# Patient Record
Sex: Male | Born: 1980 | Race: Black or African American | Hispanic: No | Marital: Married | Smoking: Never smoker
Health system: Southern US, Community
[De-identification: ages and names within clinical notes are randomized; demographics above are authoritative.]

## PROBLEM LIST (undated history)

## (undated) DIAGNOSIS — K219 Gastro-esophageal reflux disease without esophagitis: Secondary | ICD-10-CM

## (undated) DIAGNOSIS — Z87442 Personal history of urinary calculi: Secondary | ICD-10-CM

## (undated) DIAGNOSIS — K259 Gastric ulcer, unspecified as acute or chronic, without hemorrhage or perforation: Secondary | ICD-10-CM

## (undated) HISTORY — DX: Gastric ulcer, unspecified as acute or chronic, without hemorrhage or perforation: K25.9

---

## 2013-10-27 DIAGNOSIS — K259 Gastric ulcer, unspecified as acute or chronic, without hemorrhage or perforation: Secondary | ICD-10-CM

## 2013-10-27 HISTORY — DX: Gastric ulcer, unspecified as acute or chronic, without hemorrhage or perforation: K25.9

## 2013-12-31 ENCOUNTER — Encounter (HOSPITAL_COMMUNITY): Payer: Self-pay | Admitting: Emergency Medicine

## 2013-12-31 ENCOUNTER — Inpatient Hospital Stay (HOSPITAL_COMMUNITY)
Admission: EM | Admit: 2013-12-31 | Discharge: 2014-01-02 | DRG: 378 | Disposition: A | Discharge status: Home / Self Care | Payer: 59 | Attending: Internal Medicine | Admitting: Internal Medicine

## 2013-12-31 DIAGNOSIS — K219 Gastro-esophageal reflux disease without esophagitis: Secondary | ICD-10-CM | POA: Diagnosis present

## 2013-12-31 DIAGNOSIS — E860 Dehydration: Secondary | ICD-10-CM | POA: Diagnosis present

## 2013-12-31 DIAGNOSIS — R55 Syncope and collapse: Secondary | ICD-10-CM

## 2013-12-31 DIAGNOSIS — D649 Anemia, unspecified: Secondary | ICD-10-CM

## 2013-12-31 DIAGNOSIS — Y92009 Unspecified place in unspecified non-institutional (private) residence as the place of occurrence of the external cause: Secondary | ICD-10-CM

## 2013-12-31 DIAGNOSIS — W19XXXA Unspecified fall, initial encounter: Secondary | ICD-10-CM | POA: Diagnosis present

## 2013-12-31 DIAGNOSIS — S0180XA Unspecified open wound of other part of head, initial encounter: Secondary | ICD-10-CM | POA: Diagnosis present

## 2013-12-31 DIAGNOSIS — D62 Acute posthemorrhagic anemia: Secondary | ICD-10-CM

## 2013-12-31 DIAGNOSIS — F1010 Alcohol abuse, uncomplicated: Secondary | ICD-10-CM

## 2013-12-31 DIAGNOSIS — K922 Gastrointestinal hemorrhage, unspecified: Secondary | ICD-10-CM | POA: Diagnosis present

## 2013-12-31 DIAGNOSIS — K264 Chronic or unspecified duodenal ulcer with hemorrhage: Principal | ICD-10-CM

## 2013-12-31 DIAGNOSIS — S0181XA Laceration without foreign body of other part of head, initial encounter: Secondary | ICD-10-CM

## 2013-12-31 HISTORY — DX: Gastro-esophageal reflux disease without esophagitis: K21.9

## 2013-12-31 LAB — BASIC METABOLIC PANEL
BUN: 37 mg/dL — ABNORMAL HIGH (ref 6–23)
CHLORIDE: 100 meq/L (ref 96–112)
CO2: 25 meq/L (ref 19–32)
CREATININE: 1.11 mg/dL (ref 0.50–1.35)
Calcium: 8.1 mg/dL — ABNORMAL LOW (ref 8.4–10.5)
GFR calc Af Amer: >90 mL/min (ref >90)
GFR calc non Af Amer: 85 mL/min — ABNORMAL LOW (ref >90)
GLUCOSE: 80 mg/dL (ref 70–99)
Potassium: 3.7 mEq/L (ref 3.7–5.3)
Sodium: 137 mEq/L (ref 137–147)

## 2013-12-31 LAB — CBC
HCT: 30.2 % — ABNORMAL LOW (ref 39.0–52.0)
HEMOGLOBIN: 10.5 g/dL — AB (ref 13.0–17.0)
MCH: 29.5 pg (ref 26.0–34.0)
MCHC: 34.8 g/dL (ref 30.0–36.0)
MCV: 84.8 fL (ref 78.0–100.0)
PLATELETS: 241 10*3/uL (ref 150–400)
RBC: 3.56 MIL/uL — AB (ref 4.22–5.81)
RDW: 12.6 % (ref 11.5–15.5)
WBC: 8 10*3/uL (ref 4.0–10.5)

## 2013-12-31 LAB — I-STAT CHEM 8, ED
BUN: 35 mg/dL — AB (ref 6–23)
Calcium, Ion: 1.1 mmol/L — ABNORMAL LOW (ref 1.12–1.23)
Chloride: 100 mEq/L (ref 96–112)
Creatinine, Ser: 1.3 mg/dL (ref 0.50–1.35)
GLUCOSE: 83 mg/dL (ref 70–99)
HCT: 32 % — ABNORMAL LOW (ref 39.0–52.0)
Hemoglobin: 10.9 g/dL — ABNORMAL LOW (ref 13.0–17.0)
POTASSIUM: 3.7 meq/L (ref 3.7–5.3)
Sodium: 140 mEq/L (ref 137–147)
TCO2: 24 mmol/L (ref 0–100)

## 2013-12-31 LAB — I-STAT TROPONIN, ED: Troponin i, poc: 0.01 ng/mL (ref 0.00–0.08)

## 2013-12-31 NOTE — ED Notes (Signed)
EMS adm 500ml NS, 324mg  ASA

## 2013-12-31 NOTE — ED Provider Notes (Signed)
CSN: [id number]     Arrival date & time 12/31/13  2249 History   None    No chief complaint on file.    (Consider location/radiation/quality/duration/timing/severity/associated sxs/prior Treatment) HPI This patient is a 33 year old man who is brought to the emergency department by EMS after syncopal event. The patient says he was sitting on a couch with a friend watching a college basketball game when he fell he might pass out. He said up to walk to his bedroom and lay down. However, shortly after that, he felt lightheaded, passed out and fell to floor. He tried to get up from the floor then passed out again. His head struck the wall.  Paramedics report that the patient had a systolic blood pressure in the 80s and was tachycardic at the scene. The patient denies experiencing any chest pain, shortness of breath or abdominal pain. He denies history of syncope. He denies melena. He states that he has had #2 12 ounce beers tonight. No illicit drug use.   No past medical history on file. No past surgical history on file. No family history on file. History  Substance Use Topics  . Smoking status: Not on file  . Smokeless tobacco: Not on file  . Alcohol Use: Not on file    Review of Systems Ten point review of symptoms performed and is negative with the exception of symptoms noted above.     Allergies  Review of patient's allergies indicates not on file.  Home Medications  No current outpatient prescriptions on file. There were no vitals taken for this visit. Physical Exam Gen: well developed and well nourished appearing Head: 3cm lac right forehead, otherwise ncat Eyes: PERL, EOMI Nose: no epistaixis or rhinorrhea Mouth/throat: mucosa is moist and pink Neck: supple, no stridor Lungs: CTA B, no wheezing, rhonchi or rales CV: rapid and regular, pulse 104 bpm, no murmur, extremities appear well perfused.  Abd: soft, notender, nondistended Back: no ttp, no cva ttp Skin: warm and  dry Ext: normal to inspection, no dependent edema Neuro: CN ii-xii grossly intact, no focal deficits Psyche; normal affect, calm and cooperative.   ED Course  Procedures (including critical care time) Labs Review  DRE performed by RN, Vernona Rieger, she states that stool is black and hemeoccult positive.   Results for orders placed during the hospital encounter of 12/31/13 (from the past 24 hour(s))  CBC     Status: Abnormal   Collection Time    12/31/13 11:28 PM      Result Value Ref Range   WBC 8.0  4.0 - 10.5 K/uL   RBC 3.56 (*) 4.22 - 5.81 MIL/uL   Hemoglobin 10.5 (*) 13.0 - 17.0 g/dL   HCT 40.9 (*) 81.1 - 91.4 %   MCV 84.8  78.0 - 100.0 fL   MCH 29.5  26.0 - 34.0 pg   MCHC 34.8  30.0 - 36.0 g/dL   RDW 78.2  95.6 - 21.3 %   Platelets 241  150 - 400 K/uL  BASIC METABOLIC PANEL     Status: Abnormal   Collection Time    12/31/13 11:28 PM      Result Value Ref Range   Sodium 137  137 - 147 mEq/L   Potassium 3.7  3.7 - 5.3 mEq/L   Chloride 100  96 - 112 mEq/L   CO2 25  19 - 32 mEq/L   Glucose, Bld 80  70 - 99 mg/dL   BUN 37 (*) 6 - 23 mg/dL  Creatinine, Ser 1.11  0.50 - 1.35 mg/dL   Calcium 8.1 (*) 8.4 - 10.5 mg/dL   GFR calc non Af Amer 85 (*) >90 mL/min   GFR calc Af Amer >90  >90 mL/min  I-STAT TROPOININ, ED     Status: None   Collection Time    12/31/13 11:35 PM      Result Value Ref Range   Troponin i, poc 0.01  0.00 - 0.08 ng/mL   Comment 3           I-STAT CHEM 8, ED     Status: Abnormal   Collection Time    12/31/13 11:38 PM      Result Value Ref Range   Sodium 140  137 - 147 mEq/L   Potassium 3.7  3.7 - 5.3 mEq/L   Chloride 100  96 - 112 mEq/L   BUN 35 (*) 6 - 23 mg/dL   Creatinine, Ser 1.611.30  0.50 - 1.35 mg/dL   Glucose, Bld 83  70 - 99 mg/dL   Calcium, Ion 0.961.10 (*) 1.12 - 1.23 mmol/L   TCO2 24  0 - 100 mmol/L   Hemoglobin 10.9 (*) 13.0 - 17.0 g/dL   HCT 04.532.0 (*) 40.939.0 - 81.152.0 %  ETHANOL     Status: None   Collection Time    01/01/14 12:22 AM      Result  Value Ref Range   Alcohol, Ethyl (B) <11  0 - 11 mg/dL  CBC     Status: Abnormal   Collection Time    01/01/14  1:04 AM      Result Value Ref Range   WBC 7.4  4.0 - 10.5 K/uL   RBC 3.35 (*) 4.22 - 5.81 MIL/uL   Hemoglobin 9.7 (*) 13.0 - 17.0 g/dL   HCT 91.428.5 (*) 78.239.0 - 95.652.0 %   MCV 85.1  78.0 - 100.0 fL   MCH 29.0  26.0 - 34.0 pg   MCHC 34.0  30.0 - 36.0 g/dL   RDW 21.312.8  08.611.5 - 57.815.5 %   Platelets 256  150 - 400 K/uL  POC OCCULT BLOOD, ED     Status: Abnormal   Collection Time    01/01/14  1:44 AM      Result Value Ref Range   Fecal Occult Bld POSITIVE (*) NEGATIVE   EKG: nsr, no acute ischemic changes, normal intervals, normal axis, normal qrs complex with exception of signs of early repolarization in the inferolateral leads.   CRITICAL CARE Performed by: Brandt LoosenManly, Tyrah Broers   Total critical care time. 5239m.   Critical care time was exclusive of separately billable procedures and treating other patients.  Critical care was necessary to treat or prevent imminent or life-threatening deterioration.  Critical care was time spent personally by me on the following activities: development of treatment plan with patient and/or surrogate as well as nursing, discussions with consultants, evaluation of patient's response to treatment, examination of patient, obtaining history from patient or surrogate, ordering and performing treatments and interventions, ordering and review of laboratory studies, ordering and review of radiographic studies, pulse oximetry and re-evaluation of patient's condition.  LACERATION REPAIR Performed by: Brandt LoosenManly, Rai Sinagra Authorized by: Brandt LoosenManly, Aaditya Letizia Consent: Verbal consent obtained. Risks and benefits: risks, benefits and alternatives were discussed Consent given by: patient Patient identity confirmed: provided demographic data Prepped and Draped in normal sterile fashion Wound explored  Laceration Location: right eyebrow  Laceration Length: 3 cm  No Foreign Bodies  seen or palpated  Anesthesia: local infiltration  Local anesthetic: lidocaine 1% with epinephrine  Anesthetic total: 2 ml  Irrigation method: syringe Amount of cleaning: standard  Skin closure: 5.0 prolene, 4 interrupted sutures.   Patient tolerance: Patient tolerated the procedure well with no immediate complications.  MDM   Final diagnoses:  None      2320: Case discussed with Dr. Nadara Eaton who reviewed the patient's EKG remotely. He does not have any concern for STEMI as there are no recipricol changes and questionable ST elevation in inferior leads is more suggestive of early repolarization.   0300: Patient with presumed UGI bleed and syncope secondary to this. His BP has been wnl. His hgb has dropped from 32 to 8.5 over approx 3 hrs. During this interval he received 1500cc of NS. We are treating with PPI. NGT to rule out acute bleeding. Hospitalist service paged to admit.   1610: Lac repaired. Patient to floor now.     Brandt Loosen, MD 01/01/14 [phone]

## 2013-12-31 NOTE — ED Notes (Signed)
Patient was at home and stated he felt like he was getting hot.  Got up to walk outside and got dizzy and fell to his knees between the kitchen and living room.  Got up and walked to the bathroom and that is where he fell and hit his head on the wall.  Denies LOC stated he just gets hot and dizzy and falls to his knees.  +ETOH (3 12oz)  2 inch lac to the right eyebrow  Bleeding controlled

## 2014-01-01 ENCOUNTER — Encounter (HOSPITAL_COMMUNITY)
Admission: EM | Disposition: A | Discharge status: Home / Self Care | Payer: 59 | Source: Home / Self Care | Attending: Internal Medicine

## 2014-01-01 ENCOUNTER — Encounter (HOSPITAL_COMMUNITY): Payer: Self-pay

## 2014-01-01 DIAGNOSIS — K922 Gastrointestinal hemorrhage, unspecified: Secondary | ICD-10-CM | POA: Diagnosis present

## 2014-01-01 DIAGNOSIS — K264 Chronic or unspecified duodenal ulcer with hemorrhage: Secondary | ICD-10-CM

## 2014-01-01 DIAGNOSIS — D62 Acute posthemorrhagic anemia: Secondary | ICD-10-CM

## 2014-01-01 DIAGNOSIS — S0181XA Laceration without foreign body of other part of head, initial encounter: Secondary | ICD-10-CM

## 2014-01-01 DIAGNOSIS — R55 Syncope and collapse: Secondary | ICD-10-CM

## 2014-01-01 DIAGNOSIS — F1010 Alcohol abuse, uncomplicated: Secondary | ICD-10-CM

## 2014-01-01 DIAGNOSIS — D649 Anemia, unspecified: Secondary | ICD-10-CM

## 2014-01-01 DIAGNOSIS — S0180XA Unspecified open wound of other part of head, initial encounter: Secondary | ICD-10-CM

## 2014-01-01 DIAGNOSIS — K219 Gastro-esophageal reflux disease without esophagitis: Secondary | ICD-10-CM | POA: Diagnosis present

## 2014-01-01 HISTORY — PX: ESOPHAGOGASTRODUODENOSCOPY: SHX5428

## 2014-01-01 LAB — CBC
HCT: 28.5 % — ABNORMAL LOW (ref 39.0–52.0)
HCT: 28.3 % — ABNORMAL LOW (ref 39.0–52.0)
HEMATOCRIT: 27.3 % — AB (ref 39.0–52.0)
HEMATOCRIT: 26.9 % — AB (ref 39.0–52.0)
HEMOGLOBIN: 9.7 g/dL — AB (ref 13.0–17.0)
Hemoglobin: 9.7 g/dL — ABNORMAL LOW (ref 13.0–17.0)
Hemoglobin: 9.3 g/dL — ABNORMAL LOW (ref 13.0–17.0)
Hemoglobin: 9.3 g/dL — ABNORMAL LOW (ref 13.0–17.0)
MCH: 29 pg (ref 26.0–34.0)
MCH: 28.9 pg (ref 26.0–34.0)
MCH: 29.2 pg (ref 26.0–34.0)
MCH: 29.8 pg (ref 26.0–34.0)
MCHC: 34 g/dL (ref 30.0–36.0)
MCHC: 34.1 g/dL (ref 30.0–36.0)
MCHC: 34.3 g/dL (ref 30.0–36.0)
MCHC: 34.6 g/dL (ref 30.0–36.0)
MCV: 85.1 fL (ref 78.0–100.0)
MCV: 84.8 fL (ref 78.0–100.0)
MCV: 85.2 fL (ref 78.0–100.0)
MCV: 86.2 fL (ref 78.0–100.0)
PLATELETS: 245 10*3/uL (ref 150–400)
Platelets: 256 10*3/uL (ref 150–400)
Platelets: 233 10*3/uL (ref 150–400)
Platelets: 280 10*3/uL (ref 150–400)
RBC: 3.35 MIL/uL — AB (ref 4.22–5.81)
RBC: 3.22 MIL/uL — ABNORMAL LOW (ref 4.22–5.81)
RBC: 3.32 MIL/uL — ABNORMAL LOW (ref 4.22–5.81)
RBC: 3.12 MIL/uL — AB (ref 4.22–5.81)
RDW: 12.8 % (ref 11.5–15.5)
RDW: 12.7 % (ref 11.5–15.5)
RDW: 13 % (ref 11.5–15.5)
RDW: 13.1 % (ref 11.5–15.5)
WBC: 7.4 10*3/uL (ref 4.0–10.5)
WBC: 5.1 10*3/uL (ref 4.0–10.5)
WBC: 4.5 10*3/uL (ref 4.0–10.5)
WBC: 4.4 10*3/uL (ref 4.0–10.5)

## 2014-01-01 LAB — BASIC METABOLIC PANEL
BUN: 27 mg/dL — ABNORMAL HIGH (ref 6–23)
CO2: 24 meq/L (ref 19–32)
Calcium: 7.9 mg/dL — ABNORMAL LOW (ref 8.4–10.5)
Chloride: 103 mEq/L (ref 96–112)
Creatinine, Ser: 1.01 mg/dL (ref 0.50–1.35)
GFR calc Af Amer: >90 mL/min (ref >90)
GFR calc non Af Amer: >90 mL/min (ref >90)
Glucose, Bld: 104 mg/dL — ABNORMAL HIGH (ref 70–99)
Potassium: 4 mEq/L (ref 3.7–5.3)
SODIUM: 137 meq/L (ref 137–147)

## 2014-01-01 LAB — HEMOGLOBIN AND HEMATOCRIT, BLOOD
HEMATOCRIT: 26.8 % — AB (ref 39.0–52.0)
Hemoglobin: 9.4 g/dL — ABNORMAL LOW (ref 13.0–17.0)

## 2014-01-01 LAB — TYPE AND SCREEN
ABO/RH(D): O POS
Antibody Screen: NEGATIVE

## 2014-01-01 LAB — ABO/RH: ABO/RH(D): O POS

## 2014-01-01 LAB — ETHANOL: Alcohol, Ethyl (B): <11 mg/dL (ref 0–11)

## 2014-01-01 LAB — POC OCCULT BLOOD, ED: Fecal Occult Bld: POSITIVE — AB

## 2014-01-01 SURGERY — EGD (ESOPHAGOGASTRODUODENOSCOPY)
Anesthesia: Moderate Sedation

## 2014-01-01 MED ORDER — FOLIC ACID 1 MG PO TABS
1.0000 mg | ORAL_TABLET | Freq: Every day | ORAL | Status: DC
  Administered 2014-01-01 – 2014-01-02 (×2): 1 mg via ORAL
  Filled 2014-01-01 (×2): qty 1

## 2014-01-01 MED ORDER — ACETAMINOPHEN 650 MG RE SUPP: 650.0000 mg | Freq: Four times a day (QID) | RECTAL | Status: DC | PRN

## 2014-01-01 MED ORDER — ACETAMINOPHEN 325 MG PO TABS: 650.0000 mg | ORAL_TABLET | Freq: Four times a day (QID) | ORAL | Status: DC | PRN

## 2014-01-01 MED ORDER — BUTAMBEN-TETRACAINE-BENZOCAINE 2-2-14 % EX AERO
INHALATION_SPRAY | CUTANEOUS | Status: DC | PRN
Start: 2014-01-01 — End: 2014-01-01
  Administered 2014-01-01: 2 via TOPICAL

## 2014-01-01 MED ORDER — PANTOPRAZOLE SODIUM 40 MG IV SOLR
40.0000 mg | Freq: Two times a day (BID) | INTRAVENOUS | Status: DC
  Administered 2014-01-01 (×2): 40 mg via INTRAVENOUS
  Filled 2014-01-01 (×4): qty 40

## 2014-01-01 MED ORDER — LORAZEPAM 2 MG/ML IJ SOLN: 1.0000 mg | Freq: Four times a day (QID) | INTRAMUSCULAR | Status: DC | PRN

## 2014-01-01 MED ORDER — SODIUM CHLORIDE 0.9 % IV SOLN: INTRAVENOUS | Status: DC

## 2014-01-01 MED ORDER — FENTANYL CITRATE 0.05 MG/ML IJ SOLN
INTRAMUSCULAR | Status: DC | PRN
  Administered 2014-01-01 (×2): 25 ug via INTRAVENOUS

## 2014-01-01 MED ORDER — ONDANSETRON HCL 4 MG PO TABS
4.0000 mg | ORAL_TABLET | Freq: Four times a day (QID) | ORAL | Status: DC | PRN
Start: 2014-01-01 — End: 2014-01-02

## 2014-01-01 MED ORDER — ONDANSETRON HCL 4 MG/2ML IJ SOLN
4.0000 mg | Freq: Four times a day (QID) | INTRAMUSCULAR | Status: DC | PRN
Start: 2014-01-01 — End: 2014-01-02

## 2014-01-01 MED ORDER — SODIUM CHLORIDE 0.9 % IV SOLN
INTRAVENOUS | Status: DC
  Administered 2014-01-01: 10:00:00 via INTRAVENOUS

## 2014-01-01 MED ORDER — VITAMIN B-1 100 MG PO TABS
100.0000 mg | ORAL_TABLET | Freq: Every day | ORAL | Status: DC
Start: 2014-01-01 — End: 2014-01-02
  Administered 2014-01-01 – 2014-01-02 (×2): 100 mg via ORAL
  Filled 2014-01-01 (×2): qty 1

## 2014-01-01 MED ORDER — HYDROMORPHONE HCL PF 1 MG/ML IJ SOLN: 0.5000 mg | INTRAMUSCULAR | Status: DC | PRN

## 2014-01-01 MED ORDER — OXYCODONE HCL 5 MG PO TABS
5.0000 mg | ORAL_TABLET | ORAL | Status: DC | PRN
  Administered 2014-01-02: 5 mg via ORAL
  Filled 2014-01-01 (×2): qty 1

## 2014-01-01 MED ORDER — FENTANYL CITRATE 0.05 MG/ML IJ SOLN
INTRAMUSCULAR | Status: AC
  Filled 2014-01-01: qty 2

## 2014-01-01 MED ORDER — THIAMINE HCL 100 MG/ML IJ SOLN
100.0000 mg | Freq: Every day | INTRAMUSCULAR | Status: DC
  Filled 2014-01-01 (×2): qty 1

## 2014-01-01 MED ORDER — ADULT MULTIVITAMIN W/MINERALS CH
1.0000 | ORAL_TABLET | Freq: Every day | ORAL | Status: DC
  Administered 2014-01-01 – 2014-01-02 (×2): 1 via ORAL
  Filled 2014-01-01 (×2): qty 1

## 2014-01-01 MED ORDER — LORAZEPAM 2 MG/ML IJ SOLN: 0.0000 mg | Freq: Two times a day (BID) | INTRAMUSCULAR | Status: DC

## 2014-01-01 MED ORDER — LORAZEPAM 2 MG/ML IJ SOLN
0.0000 mg | Freq: Four times a day (QID) | INTRAMUSCULAR | Status: DC
  Administered 2014-01-01: 2 mg via INTRAVENOUS
  Filled 2014-01-01: qty 1

## 2014-01-01 MED ORDER — SODIUM CHLORIDE 0.9 % IJ SOLN
3.0000 mL | Freq: Two times a day (BID) | INTRAMUSCULAR | Status: DC
  Administered 2014-01-01 – 2014-01-02 (×3): 3 mL via INTRAVENOUS

## 2014-01-01 MED ORDER — ALUM & MAG HYDROXIDE-SIMETH 200-200-20 MG/5ML PO SUSP: 30.0000 mL | Freq: Four times a day (QID) | ORAL | Status: DC | PRN

## 2014-01-01 MED ORDER — DIPHENHYDRAMINE HCL 50 MG/ML IJ SOLN
INTRAMUSCULAR | Status: DC | PRN
  Administered 2014-01-01: 25 mg via INTRAVENOUS

## 2014-01-01 MED ORDER — LORAZEPAM 1 MG PO TABS: 1.0000 mg | ORAL_TABLET | Freq: Four times a day (QID) | ORAL | Status: DC | PRN

## 2014-01-01 MED ORDER — MIDAZOLAM HCL 10 MG/2ML IJ SOLN
INTRAMUSCULAR | Status: DC | PRN
  Administered 2014-01-01 (×2): 2 mg via INTRAVENOUS
  Administered 2014-01-01: 1 mg via INTRAVENOUS

## 2014-01-01 MED ORDER — SODIUM CHLORIDE 0.9 % IV BOLUS (SEPSIS)
1000.0000 mL | Freq: Once | INTRAVENOUS | Status: AC
  Administered 2013-12-31: 1000 mL via INTRAVENOUS

## 2014-01-01 MED ORDER — DIPHENHYDRAMINE HCL 50 MG/ML IJ SOLN
INTRAMUSCULAR | Status: AC
  Filled 2014-01-01: qty 1

## 2014-01-01 MED ORDER — SODIUM CHLORIDE 0.9 % IV SOLN
80.0000 mg | Freq: Once | INTRAVENOUS | Status: AC
  Administered 2014-01-01: 80 mg via INTRAVENOUS
  Filled 2014-01-01: qty 80

## 2014-01-01 MED ORDER — MIDAZOLAM HCL 5 MG/ML IJ SOLN
INTRAMUSCULAR | Status: AC
  Filled 2014-01-01: qty 2

## 2014-01-01 NOTE — ED Notes (Signed)
Laceration to right eyebrow sutured by Dr. Lavella LemonsManly.

## 2014-01-01 NOTE — Consult Note (Signed)
[facility] Consult: 12:48 PM 01/01/2014  LOS: 1 day    Referring Provider: Dr Robb Matarrtiz  Primary Care Physician:  Macky LowerSKILLMAN,KATIE, PA-C Primary Gastroenterologist:  Gentry FitzUnassigned.      Reason for Consultation:  Coffee like emesis and melenic stool, anemia   HPI: Jesse Richards is a 33 y.o. male.  Hx of GERD treated succesfully with Omeprazole, but pt not taking for > 2 months due to cost (no Rx coverage with his insurance).  Never had EGD.  Takes 2 Aleve BID for back pain. Admits to drinking 36 to 44 oz beers per day. No hx of ETOH withdrawal problems  Feeling in USOH until noting dizziness/weakness several times yesterday.  In evening, watching game with friends, acutely sweaty, dizzy, urge to defecte.  Got to commode and passed small ball of dark black stool, passed out, vomited dark material.  Hit his right head and has required stitches to eybrow.  No emesis or stools since.  Has melenic stool in ED and by my exam today.   hgb is 9.3, c/w 10.5 at arrival.  MCV 84.  Platelets in 200s. BUN max of 37, normal creatinine.  Coags nor LFTs have not been checked.  He has no hx of excessive or unusual bleeding.  + orthostatics in ED.  Rehydrated and started on BID IV Protonix.   Never had GI bleed, anemia.  His GERD sx of burning upper abdominal discomfort when stomach is empty is stable.         Past Medical History  Diagnosis Date  . GERD (gastroesophageal reflux disease)     History reviewed. No pertinent past surgical history.  Prior to Admission medications   Medication Sig Start Date End Date Taking? Authorizing Provider  naproxen sodium (ANAPROX) 220 MG tablet Take 220 mg by mouth 2 (two) times daily as needed (back pain).   Yes Historical Provider, MD    Scheduled Meds: . folic acid  1 mg Oral Daily   . LORazepam  0-4 mg Intravenous Q6H   Followed by  . [START ON 01/03/2014] LORazepam  0-4 mg Intravenous Q12H  . multivitamin with minerals  1 tablet Oral Daily  . pantoprazole (PROTONIX) IV  40 mg Intravenous Q12H  . sodium chloride  3 mL Intravenous Q12H  . thiamine  100 mg Oral Daily   Or  . thiamine  100 mg Intravenous Daily   Infusions: . sodium chloride 125 mL/hr at 01/01/14 1017   PRN Meds: acetaminophen, acetaminophen, alum & mag hydroxide-simeth, HYDROmorphone (DILAUDID) injection, LORazepam, LORazepam, ondansetron (ZOFRAN) IV, ondansetron, oxyCODONE   Allergies as of 12/31/2013  . (No Known Allergies)    Family History Mom has had PUD but not GIB No anemia No GI cancers.   History   Social History  . Marital Status: single    Spouse Name: N/A    Number of Children: N/A  . Years of Education: N/A   Occupational History  . Operates a Presenter, [employer]   Social History Main Topics  . Smoking status: Never Smoker   . Smokeless tobacco:  Never Used  . Alcohol Use: Yes  . Drug Use: No  . Sexual Activity: Yes   Other Topics Concern  . Not on file   Social History Narrative  . No problems with reading or arithmetic.     REVIEW OF SYSTEMS: Constitutional:  No weight loss, generally strong and no activity limits ENT:  No nose bleeds Pulm:  Some DOE when walking trash can down long driveway yesterday CV:  No palpitations, no LE edema. No chest pain GU:  No hematuria, no frequency.  No blood in urine.  No oliguria GI:  Per HPI.  No GERD Heme:  None in past   Transfusions:  never Neuro:  No headaches, no peripheral tingling or numbness Derm:  No itching, no rash or sores. 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   Endocrine:  No sweats or chills.  No polyuria or dysuria Immunization:  No flu shot.  Travel:  None beyond local counties in last few months.    PHYSICAL EXAM: Vital signs in last 24 hours: Filed Vitals:   01/01/14 0707  BP: 107/67  Pulse: 120  Temp:    Resp:    Wt Readings from Last 3 Encounters:  01/01/14 68.13 kg (150 lb 3.2 oz)  01/01/14 68.13 kg (150 lb 3.2 oz)   General: pleasant, comfortable, slightly diaphoretic.  Head:  Stitches above right brow.  No asymmetry, no bruises  Eyes:  No icterus or pallor.  EOMI, PERRL Ears:  Not HOH  Nose:  No discharge Mouth:  Good dentition.  No blood in mouth.  No lesions Neck:  No mass, no TMG Lungs:  Clear bil.   Unlabored breathing Heart: RRR, no MRG Abdomen:  Soft, active BS.  Not distended or tender.  No HSM or bruits.  No mass.   Rectal: black, FOB+ stool   Musc/Skeltl: no joint swelling Extremities:  No pedal edema  Neurologic:  Oriented x 3, no tremor, no spychomotor retardation.  Full limb strength Skin:  No rash Tattoos:   On left forarm and shoulder Nodes:  No cervical adenopathy   Psych:  Pleasant, cooperative, relaxed.   Intake/Output from previous day: 03/07 0701 - 03/08 0700 In: 1500 [I.V.:1500] Out: -  Intake/Output this shift:    LAB RESULTS:  Recent Labs  12/31/13 2328  01/01/14 0104 01/01/14 0507 01/01/14 0840  WBC 8.0  --  7.4  --  5.1  HGB 10.5*  < > 9.7* 9.4* 9.3*  HCT 30.2*  < > 28.5* 26.8* 27.3*  PLT 241  --  256  --  233  < > = values in this interval not displayed. BMET Lab Results  Component Value Date   NA 137 01/01/2014   NA 140 12/31/2013   NA 137 12/31/2013   K 4.0 01/01/2014   K 3.7 12/31/2013   K 3.7 12/31/2013   CL 103 01/01/2014   CL 100 12/31/2013   CL 100 12/31/2013   CO2 24 01/01/2014   CO2 25 12/31/2013   GLUCOSE 104* 01/01/2014   GLUCOSE 83 12/31/2013   GLUCOSE 80 12/31/2013   BUN 27* 01/01/2014   BUN 35* 12/31/2013   BUN 37* 12/31/2013   CREATININE 1.01 01/01/2014   CREATININE 1.30 12/31/2013   CREATININE 1.11 12/31/2013   CALCIUM 7.9* 01/01/2014   CALCIUM 8.1* 12/31/2013   LFT No results found for this basename: PROT, ALBUMIN, AST, ALT, ALKPHOS, BILITOT, BILIDIR, IBILI,  in the last 72 hours PT/INR No results found for this basename: INR,  PROTIME    Hepatitis Panel No results found for this basename: HEPBSAG, HCVAB, HEPAIGM, HEPBIGM,  in the last 72 hours C-Diff No components found with this basename: cdiff   Lipase  No results found for this basename: lipase    Drugs of Abuse  No results found for this basename: labopia,  cocainscrnur,  labbenz,  amphetmu,  thcu,  labbarb     RADIOLOGY STUDIES: No results found.  ENDOSCOPIC STUDIES: none  IMPRESSION:   *  Anemia, normocytic. Suspect ABL anemia.  FOB +, melenic stool Suspect NSAID induced ulcer.  May have element of ETOH gastritis but low suspicion for ETOH liver disease.  Hx GERD, treated with Omeprazole in past. None taken for about 2 months.   *  Syncope at home, suffering forehead right eyebrow trauma. No neuro deficits.   *  Regular beer consumption, no signs of ETOH dependence.  No LFTs checked.    PLAN:     *  EGD today   Jennye Moccasin  01/01/2014, 12:48 PM Pager: [phone]  GI ATTENDING  History, laboratories reviewed. Patient personally seen and examined. Wife at bedside. Agree with H&P as outlined above. Acute upper GI bleed with syncope. Currently stable. Suspect NSAID-induced lesion. Plan urgent upper endoscopy with therapy as needed.The nature of the procedure, as well as the risks, benefits, and alternatives were carefully and thoroughly reviewed with the patient. Ample time for discussion and questions allowed. The patient understood, was satisfied, and agreed to proceed.  Wilhemina Bonito. Eda Keys., M.D. [facility] of Gastroenterology

## 2014-01-01 NOTE — Progress Notes (Signed)
[facility] PROGRESS NOTE Interim History: 33 y.o. male with a history of daily alcohol drinking who presents to the ED with complaints of passing out twice.Marland Kitchen. He reports that he drinks 3 12 ounce beers daily for a long time. He was evaluated in the ED and was found to have a hemoglobin level of 9.7 and a FOBT was sent and found to be HEME positive and melanotic stool. He had Seen black stools and he said that he had been having black stools x 2 days   Assessment/Plan: Syncope and collapse due Acute *GI bleed: - Hbg 9.3, no previous Hbg to compare it. Has been using NSAID's - Type and screen, transfuse 1 unit of PBRC. Check orthostatics, 2 L bolus of NS. - Recheck Hbg call GI for possible endoscopy. - Start PPI. NPO  Alcohol abuse - CIWA protocol. - thiamine and folate.  Acute blood loss anemia: - type and screen. - check Hbg post transfusion.   Code Status: FULL CODE  Family Communication: No Family Present  Disposition Plan: Inpatient     Consultants:  [facility]  Procedures:  None   Antibiotics:  None  HPI/Subjective: No complains  Objective: Filed Vitals:   01/01/14 0700 01/01/14 0702 01/01/14 0704 01/01/14 0707  BP: 111/67 94/74 90/59  107/67  Pulse: 86 103 113 120  Temp: 97.9 F (36.6 C)     TempSrc: Oral     Resp: 20     Height: 5\' 5"  (1.651 m)     Weight: 68.13 kg (150 lb 3.2 oz)     SpO2: 100%       Intake/Output Summary (Last 24 hours) at 01/01/14 0949 Last data filed at 01/01/14 0127  Gross per 24 hour  Intake   1500 ml  Output      0 ml  Net   1500 ml   Filed Weights   12/31/13 2324 01/01/14 0700  Weight: 71.668 kg (158 lb) 68.13 kg (150 lb 3.2 oz)    Exam:  General: Alert, awake, oriented x3, in no acute distress.  HEENT: No bruits, no goiter.  Heart: Regular rate and rhythm, without murmurs, rubs, gallops.  Lungs: Good air movement, clear Abdomen: Soft, nontender, nondistended, positive bowel sounds.    Data  Reviewed: Basic Metabolic Panel:  Recent Labs Lab 12/31/13 2328 12/31/13 2338 01/01/14 0840  NA 137 140 137  K 3.7 3.7 4.0  CL 100 100 103  CO2 25  --  24  GLUCOSE 80 83 104*  BUN 37* 35* 27*  CREATININE 1.11 1.30 1.01  CALCIUM 8.1*  --  7.9*   Liver Function Tests: No results found for this basename: AST, ALT, ALKPHOS, BILITOT, PROT, ALBUMIN,  in the last 168 hours No results found for this basename: LIPASE, AMYLASE,  in the last 168 hours No results found for this basename: AMMONIA,  in the last 168 hours CBC:  Recent Labs Lab 12/31/13 2328 12/31/13 2338 01/01/14 0104 01/01/14 0507 01/01/14 0840  WBC 8.0  --  7.4  --  5.1  HGB 10.5* 10.9* 9.7* 9.4* 9.3*  HCT 30.2* 32.0* 28.5* 26.8* 27.3*  MCV 84.8  --  85.1  --  84.8  PLT 241  --  256  --  233   Cardiac Enzymes: No results found for this basename: CKTOTAL, CKMB, CKMBINDEX, TROPONINI,  in the last 168 hours BNP (last 3 results) No results found for this basename: PROBNP,  in the last 8760 hours CBG: No results found for  this basename: GLUCAP,  in the last 168 hours  No results found for this or any previous visit (from the past 240 hour(s)).   Studies: No results found.  Scheduled Meds: . folic acid  1 mg Oral Daily  . LORazepam  0-4 mg Intravenous Q6H   Followed by  . [START ON 01/03/2014] LORazepam  0-4 mg Intravenous Q12H  . multivitamin with minerals  1 tablet Oral Daily  . pantoprazole (PROTONIX) IV  40 mg Intravenous Q12H  . sodium chloride  3 mL Intravenous Q12H  . thiamine  100 mg Oral Daily   Or  . thiamine  100 mg Intravenous Daily   Continuous Infusions: . sodium chloride       Marinda Elk  [facility] Pager [phone]. If 8PM-8AM, please contact night-coverage at [email or web address], password [facility] 01/01/2014, 9:49 AM  LOS: 1 day

## 2014-01-01 NOTE — ED Notes (Signed)
Patient refused 2 times to have NG started.  Dr. Lavella LemonsManly notified.  Dr Lovell SheehanJenkins in to place orders for admission.

## 2014-01-01 NOTE — Op Note (Signed)
[facility] [address] [place], [id number]   ENDOSCOPY PROCEDURE REPORT  PATIENT: Jesse Richards, Jesse Richards  MR#: [id number] BIRTHDATE: 02/10/1981 , 32  yrs. old GENDER: Male ENDOSCOPIST: Roxy CedarJohn N Joannie Medine Jr, MD REFERRED BY:  [facility] PROCEDURE DATE:  01/01/2014 PROCEDURE:  EGD w/ biopsy ASA CLASS:     Class II INDICATIONS:  Melena. MEDICATIONS: Benadryl 25 mg IV, Fentanyl 50 mcg IV, and Versed 5 mg IV TOPICAL ANESTHETIC: Cetacaine Spray DESCRIPTION OF PROCEDURE: After the risks benefits and alternatives of the procedure were thoroughly explained, informed consent was obtained.  The PENTAX GASTOROSCOPE [id number] endoscope was introduced through the mouth and advanced to the second portion of the duodenum. Without limitations.  The instrument was slowly withdrawn as the mucosa was fully examined.    EXAM:The esophagus was normal.  The stomach revealed a few tiny prepyloric erosions, but was otherwise normal.  The duodenal bulb revealed multiple ulcers.  Several measured 1.5 cm.  Multiple erosions as well.  All the lesions were clean based.  No active bleeding.  CLO test taken via biopsy.  Retroflexed views revealed no abnormalities.     The scope was then withdrawn from the patient and the procedure completed.  COMPLICATIONS: There were no complications. ENDOSCOPIC IMPRESSION: 1. Multiple clean-based duodenal ulcers as described. Status post CLO biopsy  RECOMMENDATIONS: 1.  Continue IV PPI until tomorrow then convert to pantoprazole 40 mg twice a day 2.  Avoid NSAIDS 3.  Rx CLO if positive 4. If stable, discharge home tomorrow on twice a day PPI. Findings discussed with patient and his wife. Report provided. GI will follow.  REPEAT EXAM:  eSigned:  Roxy CedarJohn N Jaylin Benzel Jr, MD 01/01/2014 2:51 PM   CC:The Patient

## 2014-01-01 NOTE — H&P (Signed)
[facility] History and Physical  Jesse Richards ZOX:[id number] DOB: [birth year] DOA: 12/31/2013  Referring physician:  EDP PCP: Jesse Richards  Specialists:   Chief Complaint: Passed Out Twice  HPI: Jesse Richards is a 33 y.o. male with a history of daily alcohol drinking who presents to the ED with complaints of passing out twice and on the last fall hitting his forehead above his right eyebrow.   He denied having any headache or chest pain or SOB prior to passing out.  He reports that he drinks 3 12 ounce beers daily for a long time.   He was evaluated in the ED and was found to have a hemoglobin level of 9.7 and a FOBT was sent and found to be HEME positive for melanotic stool.   He was asked if he had  Seen black stools and he said that he had been having black stools x 2 days.   He denied having any ABD pain or nausea or vomiting.  He reports a history of GERD, and had been on Omeprazole rx in the past.      Review of Systems:  Constitutional: No Weight Loss, No Weight Gain, Night Sweats, Fevers, Chills, Fatigue, or Generalized Weakness HEENT: No Headaches, Difficulty Swallowing,Tooth/Dental Problems,Sore Throat,  No Sneezing, Rhinitis, Ear Ache, Nasal Congestion, or Post Nasal Drip,  Cardio-vascular:  No Chest pain, Orthopnea, PND, Edema in Richards extremities, Anasarca, Dizziness, Palpitations  Resp: No Dyspnea, No DOE, No Productive Cough, No Non-Productive Cough, No Hemoptysis, No Change in Color of Mucus,  No Wheezing.    GI: No Heartburn, Indigestion, Abdominal Pain, Nausea, Vomiting, Diarrhea, No Hematemesis,  No Hematochezia, +Melena, No Loss of Appetite  GU: No Dysuria, Change in Color of Urine, No Urgency or Frequency.  No flank pain.  Musculoskeletal: No Joint Pain or Swelling.  No Decreased Range of Motion. No Back Pain.  Neurologic: +Syncope, No Seizures, Muscle Weakness, Paresthesia, Vision Disturbance or Loss, No Diplopia, No Vertigo, No Difficulty Walking,   Skin: No Rash or Lesions. Psych: No Change in Mood or Affect. No Depression or Anxiety. No Memory loss. No Confusion or Hallucinations   Past Medical History  Diagnosis Date  . GERD (gastroesophageal reflux disease)       History reviewed. No pertinent past surgical history.     Prior to Admission medications   Medication Sig Start Date End Date Taking? Authorizing Provider  naproxen sodium (ANAPROX) 220 MG tablet Take 220 mg by mouth 2 (two) times daily as needed (back pain).   Yes Historical Provider, MD      No Known Allergies   Social History:  reports that he has never smoked. He has never used smokeless tobacco. He reports that he drinks alcohol. He reports that he does not use illicit drugs.     Family History:     Mother- HTN, DM2  Father- HTN   Physical Exam:  GEN:  Pleasant Well Nourished and Well Developed  33 y.o. African American male  examined  and in no acute distress; cooperative with exam Filed Vitals:   01/01/14 0700 01/01/14 0702 01/01/14 0704 01/01/14 0707  BP: 111/67 94/74 90/59  107/67  Pulse: 86 103 113 120  Temp: 97.9 F (36.6 C)     TempSrc: Oral     Resp: 20     Height: 5\' 5"  (1.651 m)     Weight: 68.13 kg (150 lb 3.2 oz)     SpO2: 100%      Blood pressure 107/67, pulse  120, temperature 97.9 F (36.6 C), temperature source Oral, resp. rate 20, height 5\' 5"  (1.651 m), weight 68.13 kg (150 lb 3.2 oz), SpO2 100.00%. PSYCH: He is alert and oriented x4; does not appear anxious does not appear depressed; affect is normal HEENT: Normocephalic and +Laceration above Right Eye Brow, Mucous membranes pink; PERRLA; EOM intact; Fundi:  Benign;  No scleral icterus, Nares: Patent, Oropharynx: Clear, Fair Dentition, Neck:  FROM, no cervical lymphadenopathy nor thyromegaly or carotid bruit; no JVD; Breasts:: Not examined CHEST WALL: No tenderness CHEST: Normal respiration, clear to auscultation bilaterally HEART: Regular rate and rhythm; no murmurs  rubs or gallops BACK: No kyphosis or scoliosis; no CVA tenderness ABDOMEN: Positive Bowel Sounds,  soft non-tender; no masses, no organomegaly. Rectal Exam: Not done EXTREMITIES: No cyanosis, clubbing or edema; no ulcerations. Genitalia: not examined PULSES: 2+ and symmetric SKIN: Normal hydration no rash or ulceration CNS:  Alert and Oriented x 4, No Focal Deficits.   Vascular: pulses palpable throughout    Labs on Admission:  Basic Metabolic Panel:  Recent Labs Lab 12/31/13 2328 12/31/13 2338  NA 137 140  K 3.7 3.7  CL 100 100  CO2 25  --   GLUCOSE 80 83  BUN 37* 35*  CREATININE 1.11 1.30  CALCIUM 8.1*  --    Liver Function Tests: No results found for this basename: AST, ALT, ALKPHOS, BILITOT, PROT, ALBUMIN,  in the last 168 hours No results found for this basename: LIPASE, AMYLASE,  in the last 168 hours No results found for this basename: AMMONIA,  in the last 168 hours CBC:  Recent Labs Lab 12/31/13 2328 12/31/13 2338 01/01/14 0104 01/01/14 0507 01/01/14 0840  WBC 8.0  --  7.4  --  5.1  HGB 10.5* 10.9* 9.7* 9.4* 9.3*  HCT 30.2* 32.0* 28.5* 26.8* 27.3*  MCV 84.8  --  85.1  --  84.8  PLT 241  --  256  --  233   Cardiac Enzymes: No results found for this basename: CKTOTAL, CKMB, CKMBINDEX, TROPONINI,  in the last 168 hours  BNP (last 3 results) No results found for this basename: PROBNP,  in the last 8760 hours CBG: No results found for this basename: GLUCAP,  in the last 168 hours  Radiological Exams on Admission: No results found.    EKG: Independently reviewed.  Sinus Tachycardia,  Rate 103.  No Acute S-T changes.      Assessment/Plan:   33 y.o. male with  Principal Problem:   GI bleed Active Problems:   Syncope and collapse   Alcohol abuse   Anemia   Laceration of forehead   GERD (gastroesophageal reflux disease)    Dehydration     1.   GI Bleed-  Probable Upper, Gastritis verus PUD,  IV Protonix q 12 hours, and IVFs, and monitor  H/Hs for decline, a Type and Screen has been sent in event of needed transfusion of which patient is amenable to having a transfusion if needed.  GI needs to be consulted in the AM.     2.   Syncope and Collapse-  Etiology unclear, may be due to ETOH,   Other causes may include cardiogenic or neurogenicor Dehydration.   Monitor on Telemetry, check Orthostatics, and Neurologic checks and IVFs for Hydration.       3.   ETOH Abuse-   Counseled and Educated about his ETOH daily intake, and placed on the CIWA Protocol with IV Ativan.     4.  Anemia - due to #1.      5.   Laceration to Forehead-  Due to #2,   sutured and dressed by EDP.     6.   GERD-  Hx, placed on Protonix rx.    7.   Dehydration-  IVFs ordered.    8.   DVT prophylaxis with SCDs.        Code Status:    FULL CODE   Family Communication:   No Family Present   Disposition Plan:   Inpatient   Time spent:  69 Minutes  Ron Parker [facility] Pager [phone]  If 7PM-7AM, please contact night-coverage [email or web address] Password [facility] 01/01/2014, 9:31 AM

## 2014-01-02 ENCOUNTER — Encounter (HOSPITAL_COMMUNITY): Payer: Self-pay | Admitting: Internal Medicine

## 2014-01-02 ENCOUNTER — Encounter: Payer: Self-pay | Admitting: Gastroenterology

## 2014-01-02 LAB — CLOTEST (H. PYLORI), BIOPSY: Helicobacter screen: POSITIVE — AB

## 2014-01-02 MED ORDER — PANTOPRAZOLE SODIUM 40 MG PO TBEC: 40.0000 mg | DELAYED_RELEASE_TABLET | Freq: Two times a day (BID) | ORAL | Status: DC

## 2014-01-02 MED ORDER — PANTOPRAZOLE SODIUM 40 MG PO TBEC
40.0000 mg | DELAYED_RELEASE_TABLET | Freq: Two times a day (BID) | ORAL | Status: DC
  Administered 2014-01-02: 40 mg via ORAL
  Filled 2014-01-02: qty 1

## 2014-01-02 NOTE — Discharge Instructions (Signed)
Jesse Richards was admitted to the Hospital on 12/31/2013 and Discharged on Discharge Date 01/02/2014 and should be excused from work/school   for 2  days starting 12/31/2013 , may return to work/school without any restrictions.  Call Lambert KetoAbraham Feliz MD, [facility] [phone] with questions.  Jesse Richards, Jesse Richards on 01/02/2014,at 9:47 AM  [facility] Office  [phone]

## 2014-01-02 NOTE — Care Management Note (Addendum)
  Page 1 of 1   01/02/2014     11:33:41 AM   CARE MANAGEMENT NOTE 01/02/2014  Patient:  Renea EeHAIRSTON,Daryon   Account Number:  [id number]  Date Initiated:  01/02/2014  Documentation initiated by:  Donato SchultzHUTCHINSON,Chevy Virgo  Subjective/Objective Assessment:   Admitted with fall, chf, GI Bleed     Action/Plan:   CM to follow for dispostion needs   Anticipated DC Date:  01/02/2014   Anticipated DC Plan:  HOME/SELF CARE         Choice offered to / List presented to:             Status of service:  Completed, signed off Medicare Important Message given?   (If response is "NO", the following Medicare IM given date fields will be blank) Date Medicare IM given:   Date Additional Medicare IM given:    Discharge Disposition:  HOME/SELF CARE  Per UR Regulation:  Reviewed for med. necessity/level of care/duration of stay  If discussed at Long Length of Stay Meetings, dates discussed:    Comments:

## 2014-01-02 NOTE — Progress Notes (Signed)
          Daily Rounding Note  01/02/2014, 8:59 AM  LOS: 2 days   SUBJECTIVE:       On full liquid diet. No nausea.  No black stool.  Still some dizziness with position change but it resolves.    OBJECTIVE:         Vital signs in last 24 hours:    Temp:  [98 F (36.7 C)-98.5 F (36.9 C)] 98.1 F (36.7 C) (03/09 0527) Pulse Rate:  [74-94] 86 (03/09 0546) Resp:  [15-24] 19 (03/09 0527) BP: (98-131)/(51-75) 116/68 mmHg (03/09 0546) SpO2:  [97 %-100 %] 100 % (03/09 0527) Weight:  [67.9 kg (149 lb 11.1 oz)] 67.9 kg (149 lb 11.1 oz) (03/09 0527) Last BM Date: 12/31/13 General: looks well   Heart: RRR Chest: clear bil Abdomen: soft, NT, ND.  Active BS  Extremities: no CCE Neuro/Psych:  Pleasant, fully alert.  No deficits  Intake/Output from previous day: 03/08 0701 - 03/09 0700 In: 720 [P.O.:720] Out: 1900 [Urine:1900]  Intake/Output this shift:    Lab Results:  Recent Labs  01/01/14 0840 01/01/14 1140 01/01/14 2213  WBC 5.1 4.5 4.4  HGB 9.3* 9.7* 9.3*  HCT 27.3* 28.3* 26.9*  PLT 233 280 245   BMET  Recent Labs  12/31/13 2328 12/31/13 2338 01/01/14 0840  NA 137 140 137  K 3.7 3.7 4.0  CL 100 100 103  CO2 25  --  24  GLUCOSE 80 83 104*  BUN 37* 35* 27*  CREATININE 1.11 1.30 1.01  CALCIUM 8.1*  --  7.9*    ASSESMENT:   *  Melena and ABL anemia.  GIB led to syncope. EGD 3/8:  Multiple duodenal ulcer  Stable Hgb. No transfusions have been required.  Still experiencing dizziness but technically not orthostatic on sitting/standing vitals.    PLAN   *  BID PPI for one month then once daily.  Provide Rx with flexibility of choice, least costly please, as cost of PPI inhibited him from taking drug in past.  *  Stop Aleve and ASA products.  *  Ok to discharge home.  *  Suggested to pt he drink less beer. He has already come to the same conclusion.  *  Should be given nest few days off from work.  *  Will  contact pt if clotest is +    Jennye MoccasinSarah Gribbin  01/02/2014, 8:59 AM Pager: [phone]  Attending MD note:   I have taken a history and reviewed the chart. I agree with the Advanced Practitioner's impression and recommendations.   Willa Roughora Marely Apgar,MD [facility] Pager # [phone]

## 2014-01-02 NOTE — Progress Notes (Signed)
UR completed Ancelmo Hunt K. Glori Machnik, RN, BSN, MSHL, CCM  01/02/2014 11:34 AM

## 2014-01-02 NOTE — Discharge Summary (Signed)
Physician Discharge Summary  Jesse Richards ZOX:[id number] DOB: 1981-06-26 DOA: 12/31/2013  PCP: Jesse Kayser, PA-C  Admit date: 12/31/2013 Discharge date: 01/02/2014  Time spent: 35 minutes  Recommendations for Outpatient Follow-up:  1. Follow up with GI and PCP  Discharge Diagnoses:  Principal Problem:   Acute GI bleeding Active Problems:   Syncope and collapse   Alcohol abuse   Anemia   Laceration of forehead   GERD (gastroesophageal reflux disease)   Duodenal ulcer hemorrhage   Discharge Condition: stable  Diet recommendation: regular  Filed Weights   12/31/13 2324 01/01/14 0700 01/02/14 0527  Weight: 71.668 kg (158 lb) 68.13 kg (150 lb 3.2 oz) 67.9 kg (149 lb 11.1 oz)    History of present illness:  33 y.o. male with a history of daily alcohol drinking who presents to the ED with complaints of passing out twice and on the last fall hitting his forehead above his right eyebrow. He denied having any headache or chest pain or SOB prior to passing out. He reports that he drinks 3 12 ounce beers daily for a long time. He was evaluated in the ED and was found to have a hemoglobin level of 9.7 and a FOBT was sent and found to be HEME positive for melanotic stool. He was asked if he had Seen black stools and he said that he had been having black stools x 2 days. He denied having any ABD pain or nausea or vomiting. He reports a history of GERD, and had been on Omeprazole rx in the past   Hospital Course:  Syncope and collapse due Acute *GI bleed:  - Hbg 9.3, no previous Hbg to compare it. Has been using NSAID's  - Type and screen, transfuse 1 unit of PBRC. Was given IV fluids. - EGD showed 3.8.2015: Multiple clean-based duodenal ulcers as described. Status post CLO biopsy - follow up with GI in 2-4 weeks.  Alcohol abuse  - CIWA protocol.  - thiamine and folate.   Acute blood loss anemia:  - type and screen, transfuse 1 unit. - Hbg stable.     Procedures:  EGD  3.8.2015  Consultations:  GI  Discharge Exam: Filed Vitals:   01/02/14 0918  BP: 122/76  Pulse: 87  Temp:   Resp:     General: A&O x3 Cardiovascular: RRR Respiratory: good air movement CTA B/L  Discharge Instructions      Discharge Orders   Future Orders Complete By Expires   Diet - low sodium heart healthy  As directed    Increase activity slowly  As directed        Medication List    STOP taking these medications       naproxen sodium 220 MG tablet  Commonly known as:  ANAPROX      TAKE these medications       pantoprazole 40 MG tablet  Commonly known as:  PROTONIX  Take 1 tablet (40 mg total) by mouth 2 (two) times daily.       No Known Allergies Follow-up Information   Follow up with [facility], PA-C.   Specialty:  Physician Assistant   Contact information:   [address] [place] [address]       Follow up with [facility], PA-C In 2 weeks. (hospital follow up)    Specialty:  Physician Assistant   Contact information:   [address] [place] [address]        The results of significant diagnostics from this hospitalization (  including imaging, microbiology, ancillary and laboratory) are listed below for reference.    Significant Diagnostic Studies: No results found.  Microbiology: No results found for this or any previous visit (from the past 240 hour(s)).   Labs: Basic Metabolic Panel:  Recent Labs Lab 12/31/13 2328 12/31/13 2338 01/01/14 0840  NA 137 140 137  K 3.7 3.7 4.0  CL 100 100 103  CO2 25  --  24  GLUCOSE 80 83 104*  BUN 37* 35* 27*  CREATININE 1.11 1.30 1.01  CALCIUM 8.1*  --  7.9*   Liver Function Tests: No results found for this basename: AST, ALT, ALKPHOS, BILITOT, PROT, ALBUMIN,  in the last 168 hours No results found for this basename: LIPASE, AMYLASE,  in the last 168 hours No results found for this basename: AMMONIA,  in the last 168 hours CBC:  Recent Labs Lab 12/31/13 2328  01/01/14 0104  01/01/14 0507 01/01/14 0840 01/01/14 1140 01/01/14 2213  WBC 8.0  --  7.4  --  5.1 4.5 4.4  HGB 10.5*  < > 9.7* 9.4* 9.3* 9.7* 9.3*  HCT 30.2*  < > 28.5* 26.8* 27.3* 28.3* 26.9*  MCV 84.8  --  85.1  --  84.8 85.2 86.2  PLT 241  --  256  --  233 280 245  < > = values in this interval not displayed. Cardiac Enzymes: No results found for this basename: CKTOTAL, CKMB, CKMBINDEX, TROPONINI,  in the last 168 hours BNP: BNP (last 3 results) No results found for this basename: PROBNP,  in the last 8760 hours CBG: No results found for this basename: GLUCAP,  in the last 168 hours     Signed:  Marinda ElkFELIZ ORTIZ, [name]  [facility] 01/02/2014, 9:36 AM

## 2014-01-05 ENCOUNTER — Other Ambulatory Visit: Payer: Self-pay

## 2014-01-05 MED ORDER — AMOXICILL-CLARITHRO-LANSOPRAZ PO MISC: Freq: Two times a day (BID) | ORAL | Status: DC

## 2014-01-16 ENCOUNTER — Ambulatory Visit: Payer: 59 | Admitting: Gastroenterology

## 2014-01-19 ENCOUNTER — Ambulatory Visit (INDEPENDENT_AMBULATORY_CARE_PROVIDER_SITE_OTHER): Payer: 59 | Admitting: Gastroenterology

## 2014-01-19 ENCOUNTER — Encounter: Payer: Self-pay | Admitting: Gastroenterology

## 2014-01-19 VITALS — BP 120/60 | HR 76 | Ht 66.0 in | Wt 154.6 lb

## 2014-01-19 DIAGNOSIS — K264 Chronic or unspecified duodenal ulcer with hemorrhage: Secondary | ICD-10-CM

## 2014-01-19 DIAGNOSIS — A048 Other specified bacterial intestinal infections: Secondary | ICD-10-CM | POA: Insufficient documentation

## 2014-01-19 NOTE — Progress Notes (Signed)
01/19/2014 Jesse Richards [id number] 02/05/1981   History of Present Illness:  This is a 33 year old male who is known to Dr. Marina GoodellPerry for recent hospitalization for gastrointestinal bleeding.  He was seen in consult on March 8 for complaints of coffee-ground emesis and melenic stool, with findings of anemia. He had been taking approximately 4 Aleve daily for quite some time. He underwent EGD the same day, March 8, and was found to have multiple clean-based ulcers in the duodenum.  CLO testing was positive for H. pylori. He is currently undergoing treatment with Prevpac (amoxicillin, Biaxin, and twice a day PPI) and is tolerating the treatment. At his hospital discharge his hemoglobin was 9.3 g. He saw his PCP last week and his hemoglobin was reportedly rechecked and was improving. We do not have the exact number, but will be calling for those reports.  He is no longer taking any NSAID's.  He says that his stools are still dark, but he is taking iron supplements.  Denies abdominal pain or any other complaints.  Current Medications, Allergies, Past Medical History, Past Surgical History, Family History and Social History were reviewed in [facility] electronic medical record.   Physical Exam: BP 120/60  Pulse 76  Ht 5\' 6"  (1.676 m)  Wt 154 lb 9.6 oz (70.126 kg)  BMI 24.96 kg/m2 General: Well developed black male in no acute distress Head: Normocephalic and atraumatic Eyes:  Sclerae anicteric, conjunctiva pink  Ears: Normal auditory acuity Lungs: Clear throughout to auscultation Heart: Regular rate and rhythm Abdomen: Soft, non-distended.  Normal bowel sounds.  Non-tender. Musculoskeletal: Symmetrical with no gross deformities  Extremities: No edema  Neurological: Alert oriented x 4, grossly non-focal Psychological:  Alert and cooperative. Normal mood and affect  Assessment and Recommendations: -Duodenal ulcers:  Is positive for Hpylori and being treated for that but was also using daily  NSAID's.  Continue to avoid NSAID's.  Once complete with Hpylori treatment will continue with PPI therapy. -Hpylori positive:  Complete treatment with Prevpak (PPI, amoxicillin, and biaxin).  Will check stool antigen in 6-8 weeks post-treatment to ensure eradication.   -ABLA:  Secondary to the above.  Had repeat labs by PCP and hgb reportedly improving.  Will obtain those results.

## 2014-01-19 NOTE — Patient Instructions (Signed)
Come to our lab on 03-09-2014 for a repeat H Pylori Stool test. Avoid NSAIDS- Advil, Aleve, Ibuprofen,  Naproxen.   Complete the H Pylori treatment.   Resume the Protonix after you have completed the H Pylori treatment.

## 2014-01-19 NOTE — Progress Notes (Signed)
Agree with assessment and plan 

## 2018-11-17 DIAGNOSIS — N529 Male erectile dysfunction, unspecified: Secondary | ICD-10-CM | POA: Diagnosis not present

## 2018-11-17 DIAGNOSIS — Z6822 Body mass index (BMI) 22.0-22.9, adult: Secondary | ICD-10-CM | POA: Diagnosis not present

## 2018-12-30 DIAGNOSIS — N5201 Erectile dysfunction due to arterial insufficiency: Secondary | ICD-10-CM | POA: Diagnosis not present

## 2018-12-30 DIAGNOSIS — R8279 Other abnormal findings on microbiological examination of urine: Secondary | ICD-10-CM | POA: Diagnosis not present

## 2018-12-30 DIAGNOSIS — R8271 Bacteriuria: Secondary | ICD-10-CM | POA: Diagnosis not present

## 2020-01-19 DIAGNOSIS — Z23 Encounter for immunization: Secondary | ICD-10-CM | POA: Diagnosis not present

## 2020-02-11 DIAGNOSIS — Z23 Encounter for immunization: Secondary | ICD-10-CM | POA: Diagnosis not present

## 2020-12-06 ENCOUNTER — Other Ambulatory Visit (HOSPITAL_COMMUNITY): Payer: Self-pay | Admitting: Urology

## 2020-12-06 ENCOUNTER — Other Ambulatory Visit: Payer: Self-pay | Admitting: Urology

## 2020-12-06 DIAGNOSIS — N201 Calculus of ureter: Secondary | ICD-10-CM

## 2020-12-06 DIAGNOSIS — N200 Calculus of kidney: Secondary | ICD-10-CM

## 2020-12-18 NOTE — Patient Instructions (Addendum)
DUE TO COVID-19 ONLY ONE VISITOR IS ALLOWED TO COME WITH YOU AND STAY IN THE WAITING ROOM ONLY DURING PRE OP AND PROCEDURE DAY OF SURGERY. THE 1 VISITOR  MAY VISIT WITH YOU AFTER SURGERY IN YOUR PRIVATE ROOM DURING VISITING HOURS ONLY!  YOU NEED TO HAVE A COVID 19 TEST ON_3/1______ @_10 :20______, THIS TEST MUST BE DONE BEFORE SURGERY,  COVID TESTING SITE [address] [place] , IT IS ON THE RIGHT GOING OUT [address] APPROXIMATELY  2 MINUTES PAST [employer] ON THE RIGHT. ONCE YOUR COVID TEST IS COMPLETED,  PLEASE BEGIN THE QUARANTINE INSTRUCTIONS AS OUTLINED IN YOUR HANDOUT.                TAYT MOYERS    Your procedure is scheduled on: 12/28/20   Report to [facility] Main  Entrance   Report to admitting at  8:00 AM     Call this number if you have problems the morning of surgery [phone]    Remember: Do not eat food or drink liquids :After Midnight  . BRUSH YOUR TEETH MORNING OF SURGERY AND RINSE YOUR MOUTH OUT, NO CHEWING GUM CANDY OR MINTS.     Take these medicines the morning of surgery with A SIP OF WATER: None                                 You may not have any metal on your body including              piercings  Do not wear jewelry,  lotions, powders or deodorant                        Men may shave face and neck.   Do not bring valuables to the hospital. [facility] IS NOT             RESPONSIBLE   FOR VALUABLES.  Contacts, dentures or bridgework may not be worn into surgery.                   Please read over the following fact sheets you were given: _____________________________________________________________________             [facility] - Preparing for Surgery Before surgery, you can play an important role.  Because skin is not sterile, your skin needs to be as free of germs as possible.  You can reduce the number of germs on your skin by washing with CHG (chlorahexidine gluconate) soap before surgery.   CHG is an antiseptic cleaner which kills germs and bonds with the skin to continue killing germs even after washing. Please DO NOT use if you have an allergy to CHG or antibacterial soaps.  If your skin becomes reddened/irritated stop using the CHG and inform your nurse when you arrive at Short Stay.  You may shave your face/neck. Please follow these instructions carefully:  1.  Shower with CHG Soap the night before surgery and the  morning of Surgery.  2.  If you choose to wash your hair, wash your hair first as usual with your  normal  shampoo.  3.  After you shampoo, rinse your hair and body thoroughly to remove the  shampoo.  4.  Use CHG as you would any other liquid soap.  You can apply chg directly  to the skin and wash                       Gently with a scrungie or clean washcloth.  5.  Apply the CHG Soap to your body ONLY FROM THE NECK DOWN.   Do not use on face/ open                           Wound or open sores. Avoid contact with eyes, ears mouth and genitals (private parts).                       Wash face,  Genitals (private parts) with your normal soap.             6.  Wash thoroughly, paying special attention to the area where your surgery  will be performed.  7.  Thoroughly rinse your body with warm water from the neck down.  8.  DO NOT shower/wash with your normal soap after using and rinsing off  the CHG Soap.             9.  Pat yourself dry with a clean towel.            10.  Wear clean pajamas.            11.  Place clean sheets on your bed the night of your first shower and do not  sleep with pets. Day of Surgery : Do not apply any lotions/deodorants the morning of surgery.  Please wear clean clothes to the hospital/surgery center.  FAILURE TO FOLLOW THESE INSTRUCTIONS MAY RESULT IN THE CANCELLATION OF YOUR SURGERY PATIENT SIGNATURE_________________________________  NURSE  SIGNATURE__________________________________  ________________________________________________________________________

## 2020-12-19 ENCOUNTER — Encounter (HOSPITAL_COMMUNITY): Payer: Self-pay

## 2020-12-19 ENCOUNTER — Encounter (HOSPITAL_COMMUNITY)
Admission: RE | Admit: 2020-12-19 | Discharge: 2020-12-19 | Disposition: A | Discharge status: Home / Self Care | Payer: BC Managed Care – PPO | Source: Ambulatory Visit | Attending: Urology | Admitting: Urology

## 2020-12-19 ENCOUNTER — Other Ambulatory Visit: Payer: Self-pay

## 2020-12-19 DIAGNOSIS — Z01812 Encounter for preprocedural laboratory examination: Secondary | ICD-10-CM | POA: Insufficient documentation

## 2020-12-19 HISTORY — DX: Personal history of urinary calculi: Z87.442

## 2020-12-19 LAB — CBC
HCT: 48.5 % (ref 39.0–52.0)
Hemoglobin: 15.9 g/dL (ref 13.0–17.0)
MCH: 28.6 pg (ref 26.0–34.0)
MCHC: 32.8 g/dL (ref 30.0–36.0)
MCV: 87.4 fL (ref 80.0–100.0)
Platelets: 291 10*3/uL (ref 150–400)
RBC: 5.55 MIL/uL (ref 4.22–5.81)
RDW: 12.8 % (ref 11.5–15.5)
WBC: 4.1 10*3/uL (ref 4.0–10.5)
nRBC: 0 % (ref 0.0–0.2)

## 2020-12-19 NOTE — Progress Notes (Signed)
COVID Vaccine Completed:Yes Date COVID Vaccine completed:07/2020 COVID vaccine manufacturer: Pfizer     PCP - [facility] PA Cardiologist - none  Chest x-ray - no EKG - no Stress Test - no ECHO - no Cardiac Cath - no Pacemaker/ICD device last checked:NA  Sleep Study - no CPAP -   Fasting Blood Sugar - NA Checks Blood Sugar _____ times a day  Blood Thinner Instructions:NA Aspirin Instructions: Last Dose:  Anesthesia review:   Patient denies shortness of breath, fever, cough and chest pain at PAT appointment  yes Patient verbalized understanding of instructions that were given to them at the PAT appointment. Patient was also instructed that they will need to review over the PAT instructions again at home before surgery yes Pt has no SOB with any activities. This is his 1st surgery.

## 2020-12-25 ENCOUNTER — Other Ambulatory Visit (HOSPITAL_COMMUNITY)
Admission: RE | Admit: 2020-12-25 | Discharge: 2020-12-25 | Disposition: A | Discharge status: Home / Self Care | Payer: BC Managed Care – PPO | Source: Ambulatory Visit | Attending: Urology | Admitting: Urology

## 2020-12-25 DIAGNOSIS — Z01812 Encounter for preprocedural laboratory examination: Secondary | ICD-10-CM | POA: Diagnosis present

## 2020-12-25 DIAGNOSIS — Z20822 Contact with and (suspected) exposure to covid-19: Secondary | ICD-10-CM | POA: Diagnosis not present

## 2020-12-25 LAB — SARS CORONAVIRUS 2 (TAT 6-24 HRS): SARS Coronavirus 2: NEGATIVE

## 2020-12-27 MED ORDER — GENTAMICIN SULFATE 40 MG/ML IJ SOLN
5.0000 mg/kg | INTRAVENOUS | Status: AC
  Administered 2020-12-28: 390 mg via INTRAVENOUS
  Filled 2020-12-27: qty 9.75

## 2020-12-28 ENCOUNTER — Encounter (HOSPITAL_COMMUNITY)
Admission: RE | Disposition: A | Discharge status: Home / Self Care | Payer: Self-pay | Source: Ambulatory Visit | Attending: Urology

## 2020-12-28 ENCOUNTER — Other Ambulatory Visit: Payer: Self-pay

## 2020-12-28 ENCOUNTER — Other Ambulatory Visit: Payer: Self-pay | Admitting: Radiology

## 2020-12-28 ENCOUNTER — Ambulatory Visit (HOSPITAL_COMMUNITY)
Admission: RE | Admit: 2020-12-28 | Discharge: 2020-12-28 | Disposition: A | Discharge status: Home / Self Care | Payer: BC Managed Care – PPO | Source: Ambulatory Visit | Attending: Urology | Admitting: Urology

## 2020-12-28 ENCOUNTER — Ambulatory Visit (HOSPITAL_COMMUNITY): Payer: BC Managed Care – PPO

## 2020-12-28 ENCOUNTER — Encounter (HOSPITAL_COMMUNITY): Payer: Self-pay | Admitting: Urology

## 2020-12-28 ENCOUNTER — Ambulatory Visit (HOSPITAL_COMMUNITY): Payer: BC Managed Care – PPO | Admitting: Physician Assistant

## 2020-12-28 ENCOUNTER — Ambulatory Visit (HOSPITAL_COMMUNITY): Payer: BC Managed Care – PPO | Admitting: Certified Registered Nurse Anesthetist

## 2020-12-28 ENCOUNTER — Ambulatory Visit (HOSPITAL_COMMUNITY)
Admission: RE | Admit: 2020-12-28 | Discharge: 2020-12-29 | Disposition: A | Discharge status: Home / Self Care | Payer: BC Managed Care – PPO | Source: Ambulatory Visit | Attending: Urology | Admitting: Urology

## 2020-12-28 DIAGNOSIS — Z87442 Personal history of urinary calculi: Secondary | ICD-10-CM | POA: Insufficient documentation

## 2020-12-28 DIAGNOSIS — Z8249 Family history of ischemic heart disease and other diseases of the circulatory system: Secondary | ICD-10-CM | POA: Insufficient documentation

## 2020-12-28 DIAGNOSIS — Z79899 Other long term (current) drug therapy: Secondary | ICD-10-CM | POA: Diagnosis not present

## 2020-12-28 DIAGNOSIS — N5201 Erectile dysfunction due to arterial insufficiency: Secondary | ICD-10-CM | POA: Diagnosis not present

## 2020-12-28 DIAGNOSIS — Z833 Family history of diabetes mellitus: Secondary | ICD-10-CM | POA: Insufficient documentation

## 2020-12-28 DIAGNOSIS — Z87891 Personal history of nicotine dependence: Secondary | ICD-10-CM | POA: Diagnosis not present

## 2020-12-28 DIAGNOSIS — N132 Hydronephrosis with renal and ureteral calculous obstruction: Secondary | ICD-10-CM | POA: Diagnosis present

## 2020-12-28 DIAGNOSIS — N200 Calculus of kidney: Secondary | ICD-10-CM

## 2020-12-28 DIAGNOSIS — N201 Calculus of ureter: Secondary | ICD-10-CM

## 2020-12-28 HISTORY — PX: IR URETERAL STENT LEFT NEW ACCESS W/O SEP NEPHROSTOMY CATH: IMG6075

## 2020-12-28 HISTORY — PX: NEPHROLITHOTOMY: SHX5134

## 2020-12-28 LAB — HEMOGLOBIN AND HEMATOCRIT, BLOOD
HCT: 44.1 % (ref 39.0–52.0)
Hemoglobin: 14.3 g/dL (ref 13.0–17.0)

## 2020-12-28 SURGERY — NEPHROLITHOTOMY PERCUTANEOUS
Anesthesia: General | Laterality: Left

## 2020-12-28 MED ORDER — LIDOCAINE HCL 1 % IJ SOLN
INTRAMUSCULAR | Status: AC
  Filled 2020-12-28: qty 20

## 2020-12-28 MED ORDER — LIDOCAINE HCL (PF) 1 % IJ SOLN
INTRAMUSCULAR | Status: AC | PRN
  Administered 2020-12-28 (×2): 10 mL via INTRADERMAL

## 2020-12-28 MED ORDER — IOHEXOL 300 MG/ML  SOLN
50.0000 mL | Freq: Once | INTRAMUSCULAR | Status: AC | PRN
  Administered 2020-12-28: 15 mL

## 2020-12-28 MED ORDER — CHLORHEXIDINE GLUCONATE 0.12 % MT SOLN
15.0000 mL | Freq: Once | OROMUCOSAL | Status: AC
  Administered 2020-12-28: 15 mL via OROMUCOSAL

## 2020-12-28 MED ORDER — HYDROCODONE-ACETAMINOPHEN 5-325 MG PO TABS: 1.0000 | ORAL_TABLET | Freq: Four times a day (QID) | ORAL | 0 refills | Status: AC | PRN

## 2020-12-28 MED ORDER — ACETAMINOPHEN 500 MG PO TABS
1000.0000 mg | ORAL_TABLET | Freq: Once | ORAL | Status: AC
  Administered 2020-12-28: 1000 mg via ORAL
  Filled 2020-12-28: qty 2

## 2020-12-28 MED ORDER — OXYBUTYNIN CHLORIDE 5 MG PO TABS
5.0000 mg | ORAL_TABLET | Freq: Three times a day (TID) | ORAL | Status: DC | PRN
  Administered 2020-12-28 – 2020-12-29 (×2): 5 mg via ORAL
  Filled 2020-12-28 (×2): qty 1

## 2020-12-28 MED ORDER — LIDOCAINE 2% (20 MG/ML) 5 ML SYRINGE
INTRAMUSCULAR | Status: DC | PRN
  Administered 2020-12-28: 100 mg via INTRAVENOUS

## 2020-12-28 MED ORDER — PHENYLEPHRINE 40 MCG/ML (10ML) SYRINGE FOR IV PUSH (FOR BLOOD PRESSURE SUPPORT)
PREFILLED_SYRINGE | INTRAVENOUS | Status: DC | PRN
  Administered 2020-12-28: 160 ug via INTRAVENOUS

## 2020-12-28 MED ORDER — DEXAMETHASONE SODIUM PHOSPHATE 10 MG/ML IJ SOLN
INTRAMUSCULAR | Status: DC | PRN
  Administered 2020-12-28: 10 mg via INTRAVENOUS

## 2020-12-28 MED ORDER — HYDROCODONE-ACETAMINOPHEN 5-325 MG PO TABS
1.0000 | ORAL_TABLET | ORAL | Status: DC | PRN
  Administered 2020-12-28: 1 via ORAL
  Administered 2020-12-29 (×3): 2 via ORAL
  Filled 2020-12-28: qty 1
  Filled 2020-12-28 (×3): qty 2

## 2020-12-28 MED ORDER — DIPHENHYDRAMINE HCL 12.5 MG/5ML PO ELIX
12.5000 mg | ORAL_SOLUTION | Freq: Four times a day (QID) | ORAL | Status: DC | PRN
  Filled 2020-12-28: qty 10

## 2020-12-28 MED ORDER — POLYETHYLENE GLYCOL 3350 17 G PO PACK: 17.0000 g | PACK | Freq: Every day | ORAL | Status: DC | PRN

## 2020-12-28 MED ORDER — KETAMINE HCL 10 MG/ML IJ SOLN
INTRAMUSCULAR | Status: AC
  Filled 2020-12-28: qty 1

## 2020-12-28 MED ORDER — FENTANYL CITRATE (PF) 250 MCG/5ML IJ SOLN
INTRAMUSCULAR | Status: AC
  Filled 2020-12-28: qty 5

## 2020-12-28 MED ORDER — DOCUSATE SODIUM 100 MG PO CAPS: 100.0000 mg | ORAL_CAPSULE | Freq: Every day | ORAL | 0 refills | Status: AC | PRN

## 2020-12-28 MED ORDER — SCOPOLAMINE 1 MG/3DAYS TD PT72
1.0000 | MEDICATED_PATCH | TRANSDERMAL | Status: DC
  Administered 2020-12-28: 1.5 mg via TRANSDERMAL
  Filled 2020-12-28: qty 1

## 2020-12-28 MED ORDER — FENTANYL CITRATE (PF) 100 MCG/2ML IJ SOLN
INTRAMUSCULAR | Status: AC
  Filled 2020-12-28: qty 2

## 2020-12-28 MED ORDER — DIPHENHYDRAMINE HCL 50 MG/ML IJ SOLN: 12.5000 mg | Freq: Four times a day (QID) | INTRAMUSCULAR | Status: DC | PRN

## 2020-12-28 MED ORDER — FENTANYL CITRATE (PF) 250 MCG/5ML IJ SOLN
INTRAMUSCULAR | Status: DC | PRN
  Administered 2020-12-28: 100 ug via INTRAVENOUS
  Administered 2020-12-28 (×3): 50 ug via INTRAVENOUS

## 2020-12-28 MED ORDER — SODIUM CHLORIDE 0.9% FLUSH
3.0000 mL | Freq: Two times a day (BID) | INTRAVENOUS | Status: DC
  Administered 2020-12-28: 3 mL via INTRAVENOUS

## 2020-12-28 MED ORDER — FENTANYL CITRATE (PF) 100 MCG/2ML IJ SOLN
INTRAMUSCULAR | Status: AC | PRN
  Administered 2020-12-28 (×4): 50 ug via INTRAVENOUS

## 2020-12-28 MED ORDER — DEXMEDETOMIDINE (PRECEDEX) IN NS 20 MCG/5ML (4 MCG/ML) IV SYRINGE
PREFILLED_SYRINGE | INTRAVENOUS | Status: DC | PRN
  Administered 2020-12-28: 20 ug via INTRAVENOUS

## 2020-12-28 MED ORDER — ONDANSETRON HCL 4 MG/2ML IJ SOLN: 4.0000 mg | INTRAMUSCULAR | Status: DC | PRN

## 2020-12-28 MED ORDER — ROCURONIUM BROMIDE 10 MG/ML (PF) SYRINGE
PREFILLED_SYRINGE | INTRAVENOUS | Status: AC
  Filled 2020-12-28: qty 10

## 2020-12-28 MED ORDER — AMISULPRIDE (ANTIEMETIC) 5 MG/2ML IV SOLN: 10.0000 mg | Freq: Once | INTRAVENOUS | Status: DC | PRN

## 2020-12-28 MED ORDER — ONDANSETRON HCL 4 MG/2ML IJ SOLN
INTRAMUSCULAR | Status: AC
  Filled 2020-12-28: qty 2

## 2020-12-28 MED ORDER — ORAL CARE MOUTH RINSE: 15.0000 mL | Freq: Once | OROMUCOSAL | Status: AC

## 2020-12-28 MED ORDER — DEXAMETHASONE SODIUM PHOSPHATE 10 MG/ML IJ SOLN
INTRAMUSCULAR | Status: AC
  Filled 2020-12-28: qty 1

## 2020-12-28 MED ORDER — BUPIVACAINE-EPINEPHRINE (PF) 0.25% -1:200000 IJ SOLN
INTRAMUSCULAR | Status: AC
  Filled 2020-12-28: qty 30

## 2020-12-28 MED ORDER — SUGAMMADEX SODIUM 200 MG/2ML IV SOLN
INTRAVENOUS | Status: DC | PRN
  Administered 2020-12-28: 200 mg via INTRAVENOUS

## 2020-12-28 MED ORDER — FENTANYL CITRATE (PF) 100 MCG/2ML IJ SOLN
25.0000 ug | INTRAMUSCULAR | Status: DC | PRN
  Administered 2020-12-28 (×2): 50 ug via INTRAVENOUS

## 2020-12-28 MED ORDER — SODIUM CHLORIDE 0.9 % IV SOLN
2.0000 g | Freq: Once | INTRAVENOUS | Status: AC
  Administered 2020-12-28: 2 g via INTRAVENOUS
  Filled 2020-12-28: qty 2

## 2020-12-28 MED ORDER — BELLADONNA ALKALOIDS-OPIUM 16.2-60 MG RE SUPP: 1.0000 | Freq: Four times a day (QID) | RECTAL | Status: DC | PRN

## 2020-12-28 MED ORDER — PROPOFOL 10 MG/ML IV BOLUS
INTRAVENOUS | Status: AC
  Filled 2020-12-28: qty 20

## 2020-12-28 MED ORDER — SODIUM CHLORIDE 0.9 % IV SOLN: 250.0000 mL | INTRAVENOUS | Status: DC | PRN

## 2020-12-28 MED ORDER — ACETAMINOPHEN 325 MG PO TABS: 650.0000 mg | ORAL_TABLET | ORAL | Status: DC | PRN

## 2020-12-28 MED ORDER — ROCURONIUM BROMIDE 10 MG/ML (PF) SYRINGE
PREFILLED_SYRINGE | INTRAVENOUS | Status: DC | PRN
  Administered 2020-12-28: 100 mg via INTRAVENOUS

## 2020-12-28 MED ORDER — FENTANYL CITRATE (PF) 100 MCG/2ML IJ SOLN
INTRAMUSCULAR | Status: AC
  Filled 2020-12-28: qty 4

## 2020-12-28 MED ORDER — MIDAZOLAM HCL 2 MG/2ML IJ SOLN
INTRAMUSCULAR | Status: AC
  Filled 2020-12-28: qty 2

## 2020-12-28 MED ORDER — CELECOXIB 200 MG PO CAPS
200.0000 mg | ORAL_CAPSULE | Freq: Once | ORAL | Status: AC
  Administered 2020-12-28: 200 mg via ORAL
  Filled 2020-12-28: qty 1

## 2020-12-28 MED ORDER — IOHEXOL 300 MG/ML  SOLN
INTRAMUSCULAR | Status: DC | PRN
  Administered 2020-12-28: 10 mL

## 2020-12-28 MED ORDER — CEPHALEXIN 500 MG PO CAPS: 500.0000 mg | ORAL_CAPSULE | Freq: Two times a day (BID) | ORAL | 0 refills | Status: AC

## 2020-12-28 MED ORDER — ONDANSETRON HCL 4 MG/2ML IJ SOLN
INTRAMUSCULAR | Status: DC | PRN
  Administered 2020-12-28: 4 mg via INTRAVENOUS

## 2020-12-28 MED ORDER — PROPOFOL 10 MG/ML IV BOLUS
INTRAVENOUS | Status: DC | PRN
  Administered 2020-12-28: 200 mg via INTRAVENOUS

## 2020-12-28 MED ORDER — POTASSIUM CHLORIDE IN NACL 20-0.45 MEQ/L-% IV SOLN
INTRAVENOUS | Status: DC
  Filled 2020-12-28 (×2): qty 1000

## 2020-12-28 MED ORDER — MIDAZOLAM HCL 2 MG/2ML IJ SOLN
INTRAMUSCULAR | Status: AC
  Filled 2020-12-28: qty 4

## 2020-12-28 MED ORDER — BUPIVACAINE-EPINEPHRINE (PF) 0.25% -1:200000 IJ SOLN
INTRAMUSCULAR | Status: DC | PRN
  Administered 2020-12-28: 10 mL

## 2020-12-28 MED ORDER — MIDAZOLAM HCL 5 MG/5ML IJ SOLN
INTRAMUSCULAR | Status: DC | PRN
  Administered 2020-12-28: 2 mg via INTRAVENOUS

## 2020-12-28 MED ORDER — KETAMINE HCL 10 MG/ML IJ SOLN
INTRAMUSCULAR | Status: DC | PRN
  Administered 2020-12-28: 50 mg via INTRAVENOUS

## 2020-12-28 MED ORDER — LACTATED RINGERS IV SOLN: INTRAVENOUS | Status: DC

## 2020-12-28 MED ORDER — SODIUM CHLORIDE 0.9 % IR SOLN
Status: DC | PRN
  Administered 2020-12-28: 18000 mL
  Administered 2020-12-28: 6000 mL

## 2020-12-28 MED ORDER — MIDAZOLAM HCL 2 MG/2ML IJ SOLN
INTRAMUSCULAR | Status: AC | PRN
  Administered 2020-12-28 (×4): 1 mg via INTRAVENOUS

## 2020-12-28 MED ORDER — SODIUM CHLORIDE 0.9% FLUSH: 3.0000 mL | INTRAVENOUS | Status: DC | PRN

## 2020-12-28 MED ORDER — LIDOCAINE 2% (20 MG/ML) 5 ML SYRINGE
INTRAMUSCULAR | Status: AC
  Filled 2020-12-28: qty 5

## 2020-12-28 MED ORDER — HYDROMORPHONE HCL 1 MG/ML IJ SOLN
0.5000 mg | INTRAMUSCULAR | Status: DC | PRN
  Administered 2020-12-28 – 2020-12-29 (×2): 1 mg via INTRAVENOUS
  Filled 2020-12-28 (×2): qty 1

## 2020-12-28 SURGICAL SUPPLY — 68 items
BAG URINE DRAIN 2000ML AR STRL (UROLOGICAL SUPPLIES) IMPLANT
BAG URO CATCHER STRL LF (MISCELLANEOUS) IMPLANT
BASKET LASER NITINOL 1.9FR (BASKET) IMPLANT
BASKET ZERO TIP NITINOL 2.4FR (BASKET) IMPLANT
BENZOIN TINCTURE PRP APPL 2/3 (GAUZE/BANDAGES/DRESSINGS) ×2 IMPLANT
BLADE SURG 15 STRL LF DISP TIS (BLADE) ×1 IMPLANT
BLADE SURG 15 STRL SS (BLADE) ×1
CATH FOLEY 2W COUNCIL 20FR 5CC (CATHETERS) ×2 IMPLANT
CATH FOLEY 2WAY SLVR  5CC 16FR (CATHETERS)
CATH FOLEY 2WAY SLVR 5CC 16FR (CATHETERS) IMPLANT
CATH INTERMIT  6FR 70CM (CATHETERS) IMPLANT
CATH MULTI PURPOSE 16FR DRAIN (CATHETERS) IMPLANT
CATH ROBINSON RED A/P 20FR (CATHETERS) IMPLANT
CATH ULTRATHANE 14FR (CATHETERS) IMPLANT
CATH URET 5FR 28IN OPEN ENDED (CATHETERS) ×2 IMPLANT
CATH URET DUAL LUMEN 6-10FR 50 (CATHETERS) ×2 IMPLANT
CATH X-FORCE N30 NEPHROSTOMY (TUBING) ×2 IMPLANT
CHLORAPREP W/TINT 26 (MISCELLANEOUS) ×4 IMPLANT
COVER WAND RF STERILE (DRAPES) IMPLANT
DRAPE C-ARM 42X120 X-RAY (DRAPES) ×2 IMPLANT
DRAPE LINGEMAN PERC (DRAPES) ×2 IMPLANT
DRAPE SHEET LG 3/4 BI-LAMINATE (DRAPES) IMPLANT
DRAPE SURG IRRIG POUCH 19X23 (DRAPES) ×2 IMPLANT
DRSG PAD ABDOMINAL 8X10 ST (GAUZE/BANDAGES/DRESSINGS) ×4 IMPLANT
DRSG TEGADERM 4X4.75 (GAUZE/BANDAGES/DRESSINGS) IMPLANT
DRSG TEGADERM 8X12 (GAUZE/BANDAGES/DRESSINGS) ×4 IMPLANT
EXTRACTOR STONE PERC NCIRCLE (MISCELLANEOUS) ×4 IMPLANT
GAUZE SPONGE 4X4 12PLY STRL (GAUZE/BANDAGES/DRESSINGS) ×2 IMPLANT
GLOVE SURG ENC TEXT LTX SZ7.5 (GLOVE) ×2 IMPLANT
GOWN STRL REUS W/TWL LRG LVL3 (GOWN DISPOSABLE) ×2 IMPLANT
GUIDEWIRE AMPLAZ .035X145 (WIRE) ×2 IMPLANT
GUIDEWIRE STR DUAL SENSOR (WIRE) ×2 IMPLANT
GUIDEWIRE SUPER STIFF (WIRE) IMPLANT
GUIDEWIRE ZIPWRE .038 STRAIGHT (WIRE) ×2 IMPLANT
IV SET EXTENSION CATH 6 NF (IV SETS) IMPLANT
KIT BASIN OR (CUSTOM PROCEDURE TRAY) ×2 IMPLANT
KIT PROBE 340X3.4XDISP GRN (MISCELLANEOUS) IMPLANT
KIT PROBE TRILOGY 3.4X340 (MISCELLANEOUS)
KIT PROBE TRILOGY 3.9X350 (MISCELLANEOUS) ×2 IMPLANT
KIT TURNOVER KIT A (KITS) ×2 IMPLANT
LASER FIB FLEXIVA PULSE ID 365 (Laser) IMPLANT
MANIFOLD NEPTUNE II (INSTRUMENTS) ×2 IMPLANT
NEEDLE TROCAR 18X15 ECHO (NEEDLE) IMPLANT
NEEDLE TROCAR 18X20 (NEEDLE) IMPLANT
NS IRRIG 1000ML POUR BTL (IV SOLUTION) ×2 IMPLANT
PACK CYSTO (CUSTOM PROCEDURE TRAY) ×2 IMPLANT
SHEATH PEELAWAY SET 9 (SHEATH) IMPLANT
SLEEVE SURGEON STRL (DRAPES) ×2 IMPLANT
SPONGE LAP 4X18 RFD (DISPOSABLE) ×2 IMPLANT
STENT URET 6FRX26 CONTOUR (STENTS) ×2 IMPLANT
SURGIFLO W/THROMBIN 8M KIT (HEMOSTASIS) IMPLANT
SUT SILK 0 FSL (SUTURE) IMPLANT
SUT SILK 2 0 30  PSL (SUTURE) ×1
SUT SILK 2 0 30 PSL (SUTURE) ×1 IMPLANT
SUT VIC AB 4-0 PS2 18 (SUTURE) IMPLANT
SYR 10ML LL (SYRINGE) ×2 IMPLANT
SYR 20ML LL LF (SYRINGE) ×4 IMPLANT
SYR 50ML LL SCALE MARK (SYRINGE) ×2 IMPLANT
TOWEL OR 17X26 10 PK STRL BLUE (TOWEL DISPOSABLE) ×2 IMPLANT
TRACTIP FLEXIVA PULS ID 200XHI (Laser) IMPLANT
TRACTIP FLEXIVA PULSE ID 200 (Laser)
TRAY FOLEY MTR SLVR 16FR STAT (SET/KITS/TRAYS/PACK) ×2 IMPLANT
TUBE CONNECTING VINYL 14FR 30C (TUBING) IMPLANT
TUBING CONNECTING 10 (TUBING) ×4 IMPLANT
TUBING STONE CATCHER TRILOGY (MISCELLANEOUS) ×2 IMPLANT
TUBING UROLOGY SET (TUBING) ×2 IMPLANT
WATER STERILE IRR 1000ML POUR (IV SOLUTION) ×2 IMPLANT
WATER STERILE IRR 3000ML UROMA (IV SOLUTION) IMPLANT

## 2020-12-28 NOTE — Op Note (Signed)
Operative Note  Preoperative diagnosis:  1.  Left renal stones  Postoperative diagnosis: 1.  Left renal stones  Procedure(s): 1.  Left percutaneous nephrolithotomy (greater than 2 cm) 2. Dilation of left renal percutaneous tract under fluoroscopy 3. Left antegrade ureteroscopy with basket stone extraction 4. Left antegrade nephrostogram 5. Fluoroscopy time less than 1 hour with interpretation 6. Left ureteral stent placement 7. Left percutaneous nephrostomy tube placement  Surgeon: Jettie Pagan, MD  Assistants:  None  Anesthesia:  General  Complications:  None  EBL:  35ml  Specimens: 1. Renal stones for stone analysis  Drains/Catheters: 1.   A 16-French Foley. 2.   Left 20-French Council tip nephrostomy tube  3. Left 6Fr x 26cm ureteral stent  Intraoperative findings:   1. Large approximately 2.5cm left renal pelvis stone successfully fragmented and extracted. 2. 34mm left ureteral stone fragmented. 3. No significant bleeding. 4. Well positioned left ureteral stent and nephrostomy tube at the end of the case.  Indication:  Jesse Richards is a 40 y.o. male with a history of urolithiasis. Their stone burden included 2.5cm left renal pelvis stone, a 3mm left ureteral stone and upper pole stones. After a thorough discussion of the risks, benefits, and alternatives of the surgery, pt agreed to proceed.  In the morning of surgery, pt went down to Interventional Radiology to get an access placed and nephroscopy films were reviewed prior to surgery today. They had good access with a 4-French nephroureteral catheter going all the way down to the bladder.  Description of procedure: After informed consent was obtained from the patient, the patient was identified, the patient was brought to the operating room and placed in supine position.   General anesthesia was administered as well as perioperative IV antibiotics with 2g of ampicillin and 5mg /kg of gentamicin.  At the beginning  of the case, a time-out was performed to properly identify the patient, the surgery to be performed, and the surgical site. Sequential compression devices were applied to lower extremities at the beginning of the case for DVT prophylaxis. We placed a Foley sterilely with pt in the supine position. The patient was then placed into a prone position onto gel rolls, making sure that all pressure points were properly padded.  The patient's left flank were then prepped and draped in sterile fashion.    A superstiff wire was passed through the 4-French open-ended catheter which was in a left interpolar calyx down to the bladder, which we could see under fluoroscopy. We made an incision around this wire. We then dilated the tract using a dual lumen. An antegrade nephrostogram was performed which demonstrated a filling defect in the left renal pelvis corresponding to the renal pelvis stone. We then passed a 0.038 Sensor wire down to the bladder.   The Bard X force balloon was then passed over the superstiff wire until the radiopaque tip was well into the left interpolar calyx.  We dilated this balloon to 18 cm of water pressure.  We advanced the sheath over the balloon.  We deflated the balloon, leaving the wire in place.   The rigid nephroscope and the Trilogy were inserted into the kidney. The left renal pelvis stone was encountered. The Trilogy was used to fragement and suction out the stone. The Perc N-circle was used to remove large fragments. All fragments were removed.   Flexible nephroscopy was performed revealing no further fragments. I was unable to identify the upper pole stones from this interpolar access. Repeat retrograde pyeloscopy  was performed using the ureteroscope. We did encounter a 22mm left proximal ureteral stone which was basket extracted. No further stones were noted.   We advanced the ureteroscope all the way down into the distal ureter. There were no stones identified along the course of the  ureter. We then passed a 0.038 zip wire through the ureteroscope into the bladder.  A 74fr x 26cm stent was then placed in the standard fashion using fluoroscopic guidance. A curl was identified in the bladder and renal pelvis.   We then advanced a 20-Fr council tip catheter over the superstiff wire into the renal pelvis. The sheath was removed. The safety wire was removed. There was no significant bleeding. A repeat antegrade nephrostogram was performed demonstrating no filling defects and no extravasation of contrast. The balloon was filled with 3cc of sterile water.   We then sutured the Council tip catheter into position using 0 silk suture.  Sterile 4x4's and ABD pad were placed around the nephrostomy tube.  Several large OpSite dressings were placed over this.    At this point, the procedure was completed. He was then transferred to the supine position and recovery room in stable condition. The patient tolerated the procedure well.  There were no immediate complications.  Plan:  Patient will be observed overnight. CT A/P non-con will be obtained to assess remaining stone burden. There may be upper pole stones which were unable to be accessed with this interpolar access. If so, we will plan for staged left ureteroscopy. We will plan to clamp the nephrostomy tube tomorrow and remove prior to discharge tomorrow if no increased pain or fevers.  Matt R. Gay MD [facility]  Pager: [phone]

## 2020-12-28 NOTE — Consult Note (Signed)
Chief Complaint: Patient was seen in consultation today for left percutaneous nephrostomy/nephroureteral catheter placement  Referring Physician(s): Gay,M  Supervising Physician: Mir, Mauri Reading  Patient Status: [facility] - Out-pt  History of Present Illness: Jesse Richards is a 40 y.o. male with past medical history of peptic ulcer disease, GERD, and now with left renal/ureteral stones who presents today for left percutaneous nephrostomy/nephroureteral catheter placement prior to nephrolithotomy.  Past Medical History:  Diagnosis Date  . GERD (gastroesophageal reflux disease)   . History of kidney stones   . Stomach ulcer 2015    Past Surgical History:  Procedure Laterality Date  . ESOPHAGOGASTRODUODENOSCOPY N/A 01/01/2014   Procedure: ESOPHAGOGASTRODUODENOSCOPY (EGD);  Surgeon: Hilarie Fredrickson, MD;  Location: [facility] ENDOSCOPY;  Service: Endoscopy;  Laterality: N/A;    Allergies: Patient has no known allergies.  Medications: Prior to Admission medications   Medication Sig Start Date End Date Taking? Authorizing Provider  HYDROcodone-acetaminophen (NORCO/VICODIN) 5-325 MG tablet Take 1 tablet by mouth every 6 (six) hours as needed for moderate pain.   Yes [provider]  ibuprofen (ADVIL) 800 MG tablet Take 800 mg by mouth every 8 (eight) hours as needed for moderate pain.   Yes [provider]     Family History  Problem Relation Age of Onset  . Diabetes Other     Social History   Socioeconomic History  . Marital status: Married    Spouse name: Not on file  . Number of children: Not on file  . Years of education: Not on file  . Highest education level: Not on file  Occupational History  . Not on file  Tobacco Use  . Smoking status: Never Smoker  . Smokeless tobacco: Never Used  Vaping Use  . Vaping Use: Every day  . Start date: 12/19/2010  . Substances: Nicotine, Flavoring  Substance and Sexual Activity  . Alcohol use: Yes    Alcohol/week: 5.0  standard drinks    Types: 5 Standard drinks or equivalent per week  . Drug use: No  . Sexual activity: Yes  Other Topics Concern  . Not on file  Social History Narrative  . Not on file   Social Determinants of Health   Financial Resource Strain: Not on file  Food Insecurity: Not on file  Transportation Needs: Not on file  Physical Activity: Not on file  Stress: Not on file  Social Connections: Not on file      Review of Systems denies fever, headache, chest pain, dyspnea, cough, abdominal pain, nausea, vomiting or visible bleeding; he does have back pain.  Vital Signs: BP (!) 143/94   Pulse 66   Temp 98.2 F (36.8 C) (Oral)   Resp 16   SpO2 100%   Physical Exam awake, alert.  Chest clear to auscultation bilaterally.  Heart with regular rate/ rhythm.  Abdomen soft, positive bowel sounds, nontender.  No lower extremity edema.  Imaging: No results found.  Labs:  CBC: Recent Labs    12/19/20 0942  WBC 4.1  HGB 15.9  HCT 48.5  PLT 291    COAGS: No results for input(s): INR, APTT in the last 8760 hours.  BMP: No results for input(s): NA, K, CL, CO2, GLUCOSE, BUN, CALCIUM, CREATININE, GFRNONAA, GFRAA in the last 8760 hours.  Invalid input(s): CMP  LIVER FUNCTION TESTS: No results for input(s): BILITOT, AST, ALT, ALKPHOS, PROT, ALBUMIN in the last 8760 hours.  TUMOR MARKERS: No results for input(s): AFPTM, CEA, CA199, CHROMGRNA in the last 8760  hours.  Assessment and Plan: 40 y.o. male with past medical history of peptic ulcer disease, GERD, and now with left renal/ureteral stones who presents today for left percutaneous nephrostomy/nephroureteral catheter placement prior to nephrolithotomy.Risks and benefits of left PCN placement was discussed with the patient including, but not limited to, infection, bleeding, significant bleeding causing loss or decrease in renal function or damage to adjacent structures.   All of the patient's questions were answered,  patient is agreeable to proceed.  Consent signed and in chart.       Thank you for this interesting consult.  I greatly enjoyed meeting JUWUAN SEDITA and look forward to participating in their care.  A copy of this report was sent to the requesting provider on this date.  Electronically Signed: D. Jeananne Rama, PA-C 12/28/2020, 8:41 AM   I spent a total of  25 minutes   in face to face in clinical consultation, greater than 50% of which was counseling/coordinating care for left percutaneous nephrostomy/nephroureteral catheter placement

## 2020-12-28 NOTE — Anesthesia Postprocedure Evaluation (Signed)
Anesthesia Post Note  Patient: Jesse Richards  Procedure(s) Performed: NEPHROLITHOTOMY PERCUTANEOUS/LEFT ANTEGRADE URETEROSCOPY WITH  BASKET STONE EXTRACTION/ STENT PLACEMENT/ ANTEGRADE PEYLOGRAM/ INTERVENTIONAL RADIOLOGY TO PLACE NEPHROSTOMY TUBE PRIOR (Left )     Patient location during evaluation: PACU Anesthesia Type: General Level of consciousness: awake and alert Pain management: pain level controlled Vital Signs Assessment: post-procedure vital signs reviewed and stable Respiratory status: spontaneous breathing, nonlabored ventilation, respiratory function stable and patient connected to nasal cannula oxygen Cardiovascular status: blood pressure returned to baseline and stable Postop Assessment: no apparent nausea or vomiting Anesthetic complications: no   No complications documented.  Last Vitals:  Vitals:   12/28/20 1730 12/28/20 1739  BP: (P) 139/90   Pulse: 74 81  Resp: (!) 25 14  Temp:    SpO2: 100% 100%    Last Pain:  Vitals:   12/28/20 1739  TempSrc:   PainSc: 6                  Beryle Lathe

## 2020-12-28 NOTE — H&P (Signed)
Office Visit Report     12/06/2020   --------------------------------------------------------------------------------   [name]  MRN: [id number]  DOB: November 17, 1980, 40 year old Male  SSN:    PRIMARY CARE:  Brayton El, [place]  REFERRING:  Brayton El, [place]  PROVIDER:  Ihor Gully, M.D.  TREATING:  Jettie Pagan, M.D.  LOCATION:  [facility] [phone]     --------------------------------------------------------------------------------   CC/HPI: Jesse Richards is a 40 year old male seen in consultation for urolithiasis.   CT A/P 12/03/2020 was reported for bilateral flank pain which revealed a 2 cm obstructing stone in the left renal pelvis as well as a 5 mm partial obstructing stone in the left proximal ureter. He also had multiple nonobstructing bilateral renal calculi.  My read:  LEFT: 2 cm left renal pelvis stone, 8 mm left upper pole stone, 9 mm left lower pole stone, 5 mm left proximal ureteral stone  RIGHT: 6 mm right interpolar stone and 3 mm right lower pole stone   He does have intermittent left flank pain which is well controlled. He denies fevers or chills. He denies dysuria. He has no recent urinary tract infection.   He does have a history of erectile dysfunction for which he takes tadalafil.   He denies prior intervention for urolithiasis.   He denies other medical problems. He denies taking anticoagulation.     ALLERGIES: None   MEDICATIONS: Tadalafil 10 mg tablet 1 tablet PO Daily PRN  Hydrocodone-Acetaminophen 5 mg-325 mg tablet 1 tablet PO Q 6 H PRN  Ibuprofen 800 mg tablet  Tramadol Hcl     GU PSH: None   NON-GU PSH: None   GU PMH: Renal and ureteral calculus - 12/05/2020 ED due to arterial insufficiency - 11/02/2020, He does appear to have mild erectile dysfunction and we discussed a trial of phosphodiesterase inhibitor therapy. In addition I have recommended further evaluation by a cardiologist to rule out early  cardiovascular disease, - 12/30/2018 Microscopic hematuria - 11/02/2020    NON-GU PMH: Pyuria/other UA findings - 11/02/2020, I noted some pyuria and bacteriuria today after he had left and I am going to therefore culture the urine although he reported no voiding symptoms., - 12/30/2018    FAMILY HISTORY: Hypertension - Mother   SOCIAL HISTORY: Marital Status: Single Preferred Language: English; Ethnicity: Not Hispanic Or Latino; Race: Black or African American Current Smoking Status: Patient does not smoke anymore.   Tobacco Use Assessment Completed: Used Tobacco in last 30 days? Does not use smokeless tobacco. Light Drinker.  Does not use drugs. Drinks 2 caffeinated drinks per day.    REVIEW OF SYSTEMS:    GU Review Male:   Patient denies frequent urination, hard to postpone urination, burning/ pain with urination, get up at night to urinate, leakage of urine, stream starts and stops, trouble starting your stream, have to strain to urinate , erection problems, and penile pain.  Gastrointestinal (Upper):   Patient denies nausea, vomiting, and indigestion/ heartburn.  Gastrointestinal (Lower):   Patient denies diarrhea and constipation.  Constitutional:   Patient denies fever, night sweats, weight loss, and fatigue.  Skin:   Patient denies skin rash/ lesion and itching.  Eyes:   Patient denies blurred vision and double vision.  Ears/ Nose/ Throat:   Patient denies sore throat and sinus problems.  Hematologic/Lymphatic:   Patient denies swollen glands and easy bruising.  Cardiovascular:   Patient denies leg swelling and chest pains.  Respiratory:   Patient denies cough and shortness  of breath.  Endocrine:   Patient denies excessive thirst.  Musculoskeletal:   Patient denies back pain and joint pain.  Neurological:   Patient denies headaches and dizziness.  Psychologic:   Patient denies anxiety and depression.   VITAL SIGNS:      12/06/2020 10:52 AM  Weight 167 lb / 75.75 kg  Height 66  in / 167.64 cm  BP 125/78 mmHg  Pulse 79 /min  Temperature 97.5 F / 36.3 C  BMI 27.0 kg/m   MULTI-SYSTEM PHYSICAL EXAMINATION:    Constitutional: Well-nourished. No physical deformities. Normally developed. Good grooming.  Respiratory: No labored breathing, no use of accessory muscles.   Cardiovascular: Normal temperature, normal extremity pulses, no swelling, no varicosities.  Gastrointestinal: No mass, no tenderness, no rigidity, non obese abdomen. No CVA T b/l     Complexity of Data:  Source Of History:  Patient, Medical Record Summary  Records Review:   Previous Doctor Records, Previous Patient Records  Urine Test Review:   Urinalysis  X-Ray Review: C.T. Abdomen/Pelvis: Reviewed Films. Reviewed Report. Discussed With Patient.    Notes:                     CLINICAL DATA: 40 year old male with bilateral lower back pain.  Concern for kidney stone.   EXAM:  CT ABDOMEN AND PELVIS WITHOUT CONTRAST   TECHNIQUE:  Multidetector CT imaging of the abdomen and pelvis was performed  following the standard protocol without IV contrast.   COMPARISON: None.   FINDINGS:  Evaluation of this exam is limited in the absence of intravenous  contrast.   Lower chest: Apparent trace bilateral pleural effusions. The  visualized lung bases are otherwise clear.   No intra-abdominal free air or free fluid.   Hepatobiliary: No focal liver abnormality is seen. No gallstones,  gallbladder wall thickening, or biliary dilatation.   Pancreas: Unremarkable. No pancreatic ductal dilatation or  surrounding inflammatory changes.   Spleen: Normal in size without focal abnormality.   Adrenals/Urinary Tract: The adrenal glands unremarkable. There are  multiple nonobstructing bilateral renal calculi measure up to 9 mm  in the inferior pole of the left kidney. There is a 2 cm obstructing  stone in the left renal pelvis/ureteropelvic junction. There is mild  left hydronephrosis. No hydronephrosis on the  right.   There is a partially obstructing 5 mm stone in the proximal left  ureter. The right ureter and urinary bladder appear unremarkable.   Stomach/Bowel: There is no bowel obstruction or active inflammation.  The appendix is not visualized with certainty. No inflammatory  changes identified in the right lower quadrant.   Vascular/Lymphatic: The abdominal aorta and IVC unremarkable. No  portal venous gas. There is no adenopathy.   Reproductive: The prostate and seminal vesicles are grossly  unremarkable. No pelvic mass.   Other: None   Musculoskeletal: No acute or significant osseous findings.   IMPRESSION:  1. A 2 cm obstructing stone in the left renal pelvis/ureteropelvic  junction as well as a 5 mm partially obstructing stone in the  proximal left ureter. Mild left hydronephrosis.  2. Multiple additional nonobstructing bilateral renal calculi. No  hydronephrosis on the right.  3. No bowel obstruction.    Electronically Signed  By: Elgie CollardArash Radparvar M.D.  On: 12/03/2020 20:27   PROCEDURES:          Urinalysis w/Scope Dipstick Dipstick Cont'd Micro  Color: Yellow Bilirubin: Neg mg/dL WBC/hpf: 0 - 5/hpf  Appearance: Clear Ketones: Neg mg/dL RBC/hpf:  3 - 10/hpf  Specific Gravity: 1.010 Blood: 3+ ery/uL Bacteria: Rare (0-9/hpf)  pH: 6.0 Protein: Neg mg/dL Cystals: NS (Not Seen)  Glucose: Neg mg/dL Urobilinogen: 0.2 mg/dL Casts: NS (Not Seen)    Nitrites: Neg Trichomonas: Not Present    Leukocyte Esterase: 1+ leu/uL Mucous: Not Present      Epithelial Cells: NS (Not Seen)      Yeast: NS (Not Seen)      Sperm: Not Present    ASSESSMENT:      ICD-10 Details  1 GU:   Renal and ureteral calculus - N20.2   2   ED due to arterial insufficiency - N52.01    PLAN:           Orders Labs CULTURE, URINE          Document Letter(s):  Created for Patient: Clinical Summary         Notes:   #1. Urolithiasis: CT A/P 12/03/2020 with bilateral renal stones. We are planning to  treat the LEFT side.  -My read:  LEFT: 2 cm left renal pelvis stone, 8 mm left upper pole stone, 9 mm left lower pole stone, 5 mm left proximal ureteral stone  RIGHT: 6 mm right interpolar stone and 3 mm right lower pole stone  -We will proceed with L PCNL, L URS/LL, L RPG, L stent placement, possible L PCN placement. We will plan for IR obtain access. Discussed risks and benefits. Surgery letter already sent by Peyton Najjar. Confirmed with Coni.  PCNL: risks and benefits of PCNL were outlined including infection, bleeding, blood transfusion, pain, pneumothorax, bowel injury, persistent urine leak, positioning injury, inability to clear stone burden, renal laceration, arterial venous fistula or malformation, need for ancillary treatments, and global anesthesia risks including but not limited to CVA, MI, DVT, PE, pneumonia, and death.   #2. Erectile dysfunction: Continue tadalafil.   CC: Brayton El, PA        Next Appointment:      Next Appointment: 12/28/2020 11:30 AM    Appointment Type: Surgery     Location: [facility] [phone]    Provider: Jettie Pagan, M.D.    Reason for Visit: WL/EXT REC LT PCNL , LT ANTEGRADE URS, HLL/BSKT , STENT, LT ANTEGRADE PYELOGRAM       Signed by Jettie Pagan, M.D. on 12/06/20 at 5:44 PM (EST)  Urology Preoperative H&P   Chief Complaint: Bilateral renal stones here for treatment of left sided renal and ureteral stones  History of Present Illness: Jesse Richards is a 40 y.o. male with bilateral renal stones here for treatment of left sided renal and ureteral stones. Denies fevers or chills. Had a negative urine culture prior.    Past Medical History:  Diagnosis Date  . GERD (gastroesophageal reflux disease)   . History of kidney stones   . Stomach ulcer 2015    Past Surgical History:  Procedure Laterality Date  . ESOPHAGOGASTRODUODENOSCOPY N/A 01/01/2014   Procedure: ESOPHAGOGASTRODUODENOSCOPY (EGD);  Surgeon: Hilarie Fredrickson,  MD;  [facility] ENDOSCOPY;  Service: Endoscopy;  Laterality: N/A;  . IR URETERAL STENT LEFT NEW ACCESS W/O SEP NEPHROSTOMY CATH  12/28/2020    Allergies: No Known Allergies  Family History  Problem Relation Age of Onset  . Diabetes Other     Social History:  reports that he has never smoked. He has never used smokeless tobacco. He reports current alcohol use of about 5.0 standard drinks of alcohol per week. He reports that he  does not use drugs.  ROS: A complete review of systems was performed.  All systems are negative except for pertinent findings as noted.  Physical Exam:  Vital signs in last 24 hours: Temp:  [97.8 F (36.6 C)-98.2 F (36.8 C)] 97.8 F (36.6 C) (03/04 1100) Pulse Rate:  [64-74] 68 (03/04 1130) Resp:  [13-21] 17 (03/04 1130) BP: (101-143)/(66-94) 108/66 (03/04 1130) SpO2:  [98 %-100 %] 100 % (03/04 1130) Constitutional:  Alert and oriented, No acute distress Cardiovascular: Regular rate and rhythm Respiratory: Normal respiratory effort, Lungs clear bilaterally GI: Abdomen is soft, nontender, nondistended, no abdominal masses GU: No CVA tenderness Lymphatic: No lymphadenopathy Neurologic: Grossly intact, no focal deficits Psychiatric: Normal mood and affect  Laboratory Data:  No results for input(s): WBC, HGB, HCT, PLT in the last 72 hours.  No results for input(s): NA, K, CL, GLUCOSE, BUN, CALCIUM, CREATININE in the last 72 hours.  Invalid input(s): CO3   No results found for this or any previous visit (from the past 24 hour(s)). Recent Results (from the past 240 hour(s))  SARS CORONAVIRUS 2 (TAT 6-24 HRS) Nasopharyngeal Nasopharyngeal Swab     Status: None   Collection Time: 12/25/20 10:55 AM   Specimen: Nasopharyngeal Swab  Result Value Ref Range Status   SARS Coronavirus 2 NEGATIVE NEGATIVE Final    Comment: (NOTE) SARS-CoV-2 target nucleic acids are NOT DETECTED.  The SARS-CoV-2 RNA is generally detectable in upper and lower respiratory  specimens during the acute phase of infection. Negative results do not preclude SARS-CoV-2 infection, do not rule out co-infections with other pathogens, and should not be used as the sole basis for treatment or other patient management decisions. Negative results must be combined with clinical observations, patient history, and epidemiological information. The expected result is Negative.  Fact Sheet for Patients: HairSlick.no  Fact Sheet for Healthcare Providers: quierodirigir.com  This test is not yet approved or cleared by the [place] FDA and  has been authorized for detection and/or diagnosis of SARS-CoV-2 by FDA under an Emergency Use Authorization (EUA). This EUA will remain  in effect (meaning this test can be used) for the duration of the COVID-19 declaration under Se ction 564(b)(1) of the Act, 21 U.S.C. section 360bbb-3(b)(1), unless the authorization is terminated or revoked sooner.  Performed at [facility], [address], [place], [place] [address]     Renal Function: No results for input(s): CREATININE in the last 168 hours. CrCl cannot be calculated (Patient's most recent lab result is older than the maximum 21 days allowed.).  Radiologic Imaging: IR URETERAL STENT LEFT NEW ACCESS W/O SEP NEPHROSTOMY CATH  Result Date: 12/28/2020 INDICATION: Left nephrolithiasis. Interventional radiology consulted for percutaneous access prior to PCNL. EXAM: Left nephroureteral catheter placement COMPARISON:  CT abdomen pelvis with contrast 12/03/2020 MEDICATIONS: Ampicillin 2 g IV; The antibiotic was administered in an appropriate time frame prior to skin puncture. ANESTHESIA/SEDATION: Fentanyl 200 mcg IV; Versed 4 mg IV Moderate Sedation Time:  28 minutes The patient was continuously monitored during the procedure by the interventional radiology nurse under my direct supervision. FLUOROSCOPY TIME:  Fluoroscopy Time:  10 minutes 42 seconds (66 mGy). COMPLICATIONS: None immediate. PROCEDURE: Informed written consent was obtained from the patient after a thorough discussion of the procedural risks, benefits and alternatives. All questions were addressed. Maximal Sterile Barrier Technique was utilized including caps, mask, sterile gowns, sterile gloves, sterile drape, hand hygiene and skin antiseptic. A timeout was performed prior to the initiation of the  procedure. Left flank skin prepped and draped in usual fashion. Sterile ultrasound probe cover and gel utilized throughout the procedure. Using ultrasound guidance, a dilated mid pole calyx was accessed with a 21 gauge needle. Urine return was noted from the 21 gauge needle. Contrast administered through the needle opacified the renal collecting system. Air was also administered through the needle in order to opacify a posterior calyx. Utilizing fluoroscopic guidance, 1 of the stone containing posterior calices was accessed with a 21 gauge needle. Urine return was noted. 0.018 inch guidewire advanced through the needle into the ureter. Transitional dilator inserted over 0.018 inch guidewire. Contrast administered under fluoroscopy through the 5 French dilator opacified the ureter and renal collecting system. Glidewire and Kumpe catheter were advanced into the bladder. Glidewire removed and Kumpe catheter secured to skin with tape. Patient tolerated the procedure well. He was transferred back to recovery in anticipation of PCNL in the OR. IMPRESSION: Successful insertion of left nephroureteral catheter for purposes of PCNL. Lower pole posterior inferior stone containing calyx was accessed. Electronically Signed   By: Acquanetta Belling M.D.   On: 01/18/21 12:21    I independently reviewed the above imaging studies.  Assessment and Plan KEIJI MELLAND is a 40 y.o. male with  bilateral renal stones here for treatment of left sided renal and ureteral stones. Denies fevers or  chills. Had a negative urine culture prior.  To OR for L PCNL, L RPG, L URS/LL, left stent placement, L PCN placement.  Risks and benefits of PCNL were outlined including infection, bleeding, blood transfusion, pain, pneumothorax, bowel injury, persistent urine leak, positioning injury, inability to clear stone burden, renal laceration, arterial venous fistula or malformation, need for ancillary treatments, and global anesthesia risks including but not limited to CVA, MI, DVT, PE, pneumonia, and death.   Matt R. Gay MD 01/18/2021, 2:05 PM  [facility] Pager: [phone]

## 2020-12-28 NOTE — Transfer of Care (Signed)
Immediate Anesthesia Transfer of Care Note  Patient: Jesse Richards  Procedure(s) Performed: NEPHROLITHOTOMY PERCUTANEOUS/LEFT ANTEGRADE URETEROSCOPY WITH  BASKET STONE EXTRACTION/ STENT PLACEMENT/ ANTEGRADE PEYLOGRAM/ INTERVENTIONAL RADIOLOGY TO PLACE NEPHROSTOMY TUBE PRIOR (Left )  Patient Location: PACU  Anesthesia Type:General  Level of Consciousness: awake, alert  and oriented  Airway & Oxygen Therapy: Patient Spontanous Breathing and Patient connected to face mask  Post-op Assessment: Report given to RN and Post -op Vital signs reviewed and stable  Post vital signs: Reviewed and stable  Last Vitals:  Vitals Value Taken Time  BP 117/86 12/28/20 1646  Temp    Pulse 87 12/28/20 1649  Resp 20 12/28/20 1649  SpO2 100 % 12/28/20 1649  Vitals shown include unvalidated device data.  Last Pain:  Vitals:   12/28/20 1230  TempSrc:   PainSc: 0-No pain      Patients Stated Pain Goal: 5 (12/28/20 0849)  Complications: No complications documented.

## 2020-12-28 NOTE — Anesthesia Procedure Notes (Signed)
Procedure Name: Intubation Performed by: Ottavio Norem A, CRNA Pre-anesthesia Checklist: Patient identified, Emergency Drugs available, Suction available and Patient being monitored Patient Re-evaluated:Patient Re-evaluated prior to induction Oxygen Delivery Method: Circle system utilized Preoxygenation: Pre-oxygenation with 100% oxygen Induction Type: IV induction Ventilation: Mask ventilation without difficulty Laryngoscope Size: Miller and 2 Grade View: Grade I Tube type: Oral Number of attempts: 1 Airway Equipment and Method: Stylet Placement Confirmation: ETT inserted through vocal cords under direct vision,  positive ETCO2 and breath sounds checked- equal and bilateral Secured at: 22 cm Tube secured with: Tape Dental Injury: Teeth and Oropharynx as per pre-operative assessment        

## 2020-12-28 NOTE — Procedures (Signed)
Interventional Radiology Procedure Note  Procedure: Left nephroureteral placement  Indication: Left nephrolithiasis  Findings:  5 fr catheter placed through inferior lateral stone containing left lower pole caylx.  Catheter tip in bladder.  Please refer to procedural dictation for full description.  Complications: None  EBL: < 10 mL  Acquanetta Belling, MD [phone]

## 2020-12-28 NOTE — Anesthesia Preprocedure Evaluation (Addendum)
Anesthesia Evaluation  Patient identified by MRN, date of birth, ID band Patient awake    Reviewed: Allergy & Precautions, NPO status , Patient's Chart, lab work & pertinent test results  History of Anesthesia Complications Negative for: history of anesthetic complications  Airway Mallampati: II  TM Distance: >3 FB Neck ROM: Full    Dental no notable dental hx. (+) Dental Advisory Given   Pulmonary neg pulmonary ROS,    Pulmonary exam normal        Cardiovascular negative cardio ROS Normal cardiovascular exam     Neuro/Psych negative neurological ROS     GI/Hepatic Neg liver ROS, PUD, GERD  ,  Endo/Other  negative endocrine ROS  Renal/GU negative Renal ROS     Musculoskeletal negative musculoskeletal ROS (+)   Abdominal   Peds  Hematology negative hematology ROS (+)   Anesthesia Other Findings   Reproductive/Obstetrics                            Anesthesia Physical Anesthesia Plan  ASA: II  Anesthesia Plan: General   Post-op Pain Management:    Induction: Intravenous  PONV Risk Score and Plan: 4 or greater and Ondansetron, Dexamethasone, Midazolam and Scopolamine patch - Pre-op  Airway Management Planned: Oral ETT  Additional Equipment:   Intra-op Plan:   Post-operative Plan: Extubation in OR  Informed Consent: I have reviewed the patients History and Physical, chart, labs and discussed the procedure including the risks, benefits and alternatives for the proposed anesthesia with the patient or authorized representative who has indicated his/her understanding and acceptance.     Dental advisory given  Plan Discussed with: Anesthesiologist and CRNA  Anesthesia Plan Comments:        Anesthesia Quick Evaluation

## 2020-12-28 NOTE — Discharge Instructions (Signed)

## 2020-12-29 ENCOUNTER — Ambulatory Visit (HOSPITAL_COMMUNITY): Payer: BC Managed Care – PPO

## 2020-12-29 ENCOUNTER — Encounter (HOSPITAL_COMMUNITY): Payer: Self-pay | Admitting: Urology

## 2020-12-29 DIAGNOSIS — N132 Hydronephrosis with renal and ureteral calculous obstruction: Secondary | ICD-10-CM | POA: Diagnosis not present

## 2020-12-29 LAB — BASIC METABOLIC PANEL
Anion gap: 7 (ref 5–15)
BUN: 19 mg/dL (ref 6–20)
CO2: 28 mmol/L (ref 22–32)
Calcium: 9.2 mg/dL (ref 8.9–10.3)
Chloride: 98 mmol/L (ref 98–111)
Creatinine, Ser: 1.4 mg/dL — ABNORMAL HIGH (ref 0.61–1.24)
GFR, Estimated: >60 mL/min (ref >60)
Glucose, Bld: 129 mg/dL — ABNORMAL HIGH (ref 70–99)
Potassium: 4.1 mmol/L (ref 3.5–5.1)
Sodium: 133 mmol/L — ABNORMAL LOW (ref 135–145)

## 2020-12-29 LAB — CBC
HCT: 44.3 % (ref 39.0–52.0)
Hemoglobin: 14.4 g/dL (ref 13.0–17.0)
MCH: 29.3 pg (ref 26.0–34.0)
MCHC: 32.5 g/dL (ref 30.0–36.0)
MCV: 90 fL (ref 80.0–100.0)
Platelets: 281 10*3/uL (ref 150–400)
RBC: 4.92 MIL/uL (ref 4.22–5.81)
RDW: 12.5 % (ref 11.5–15.5)
WBC: 9.4 10*3/uL (ref 4.0–10.5)
nRBC: 0 % (ref 0.0–0.2)

## 2020-12-29 MED ORDER — CHLORHEXIDINE GLUCONATE CLOTH 2 % EX PADS: 6.0000 | MEDICATED_PAD | Freq: Every day | CUTANEOUS | Status: DC

## 2020-12-29 NOTE — Plan of Care (Signed)
  Problem: Health Behavior/Discharge Planning: Goal: Ability to manage health-related needs will improve Outcome: Progressing   Problem: Clinical Measurements: Goal: Will remain free from infection Outcome: Progressing   Problem: Activity: Goal: Risk for activity intolerance will decrease Outcome: Progressing   Problem: Elimination: Goal: Will not experience complications related to bowel motility Outcome: Progressing   Problem: Elimination: Goal: Will not experience complications related to urinary retention Outcome: Progressing   Problem: Pain Managment: Goal: General experience of comfort will improve Outcome: Progressing

## 2020-12-29 NOTE — Discharge Summary (Signed)
  Date of admission: 12/28/2020  Date of discharge: 12/29/2020  Admission diagnosis: Left renal calculi  Discharge diagnosis: Left renal calculi  History and Physical: For full details, please see admission history and physical. Briefly, Jesse Richards is a 40 y.o. year old patient with large burden left renal calculi.    Hospital Course: Briefly, Jesse Richards is a 40 y.o. year old patient with large burden left renal calculi.  He underwent left PCNL on 12/28/20.  This was well tolerated.  A CT scan on POD# 1 demonstrated the majority of stone to have been removed with a few lower pole calculi.  These results were discussed with Dr. Cardell Peach who confirmed that they were not readily accessible through the current percutaneous access.  Therefore, the PCN tube and Foley were removed.  He has an indwelling ureteral stent.  He was discharge home on POD # 1.  Laboratory values: Recent Labs    12/28/20 1732 12/29/20 0442  HGB 14.3 14.4  HCT 44.1 44.3   Recent Labs    12/29/20 0442  CREATININE 1.40*    Disposition: Home  Discharge instruction: The patient was instructed to be ambulatory but told to refrain from heavy lifting, strenuous activity, or driving.  He was instructed on dressing changes.  Discharge medications:  Allergies as of 12/29/2020   No Known Allergies     Medication List    TAKE these medications   cephALEXin 500 MG capsule Commonly known as: KEFLEX Take 1 capsule (500 mg total) by mouth 2 (two) times daily for 3 days. Start taking day prior to stent removal, day of stent removal and day after stent removal which is scheduled in 2 weeks.   docusate sodium 100 MG capsule Commonly known as: Colace Take 1 capsule (100 mg total) by mouth daily as needed for up to 30 doses.   HYDROcodone-acetaminophen 5-325 MG tablet Commonly known as: NORCO/VICODIN Take 1 tablet by mouth every 6 (six) hours as needed for up to 18 doses for moderate pain.   ibuprofen 800 MG  tablet Commonly known as: ADVIL Take 800 mg by mouth every 8 (eight) hours as needed for moderate pain.       Followup:   Follow-up Information    [facility] On 01/16/2021.   Why: 8:45AM for stent removal Contact information: [address] [place] [address] [phone]

## 2022-06-24 IMAGING — XA IR URETURAL STENT LEFT NEW ACCESS W/O SEP NEPHROSTOMY CATH
5 series · 12 of 12 positions shown · non-contrast
Comparison: CT abdomen pelvis with contrast 12/03/2020

INDICATION: Left nephrolithiasis. Interventional radiology consulted for
percutaneous access prior to PCNL.

EXAM:
Left nephroureteral catheter placement

[Series 1: care single · 2 of 2 slices shown (1 of 4)]
[im 1/2]
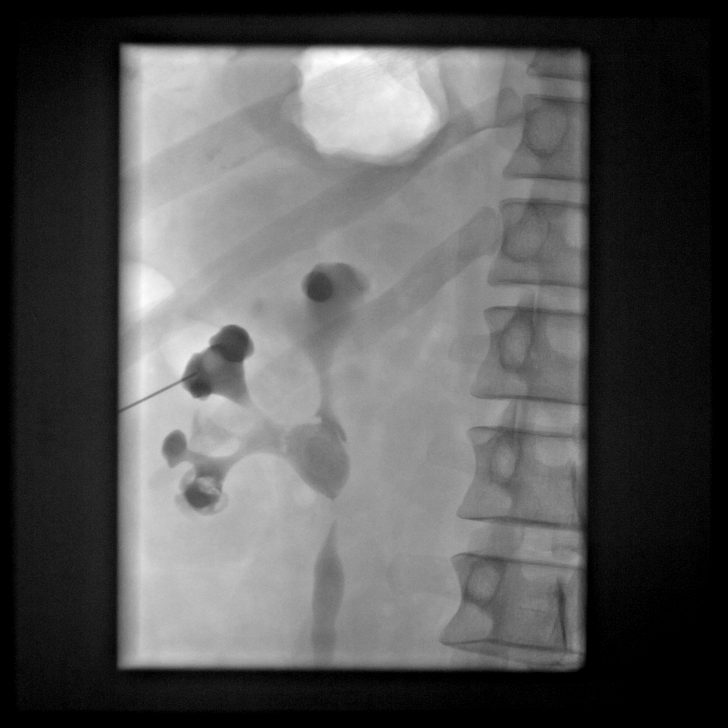
[im 2/2]
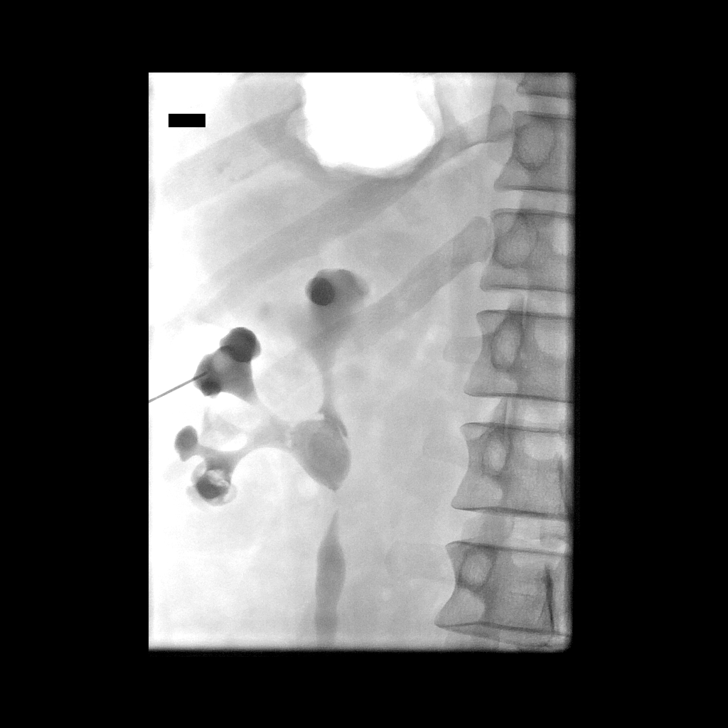

[Series 3: care single · 2 of 2 slices shown (2 of 4)]
[im 1/2]
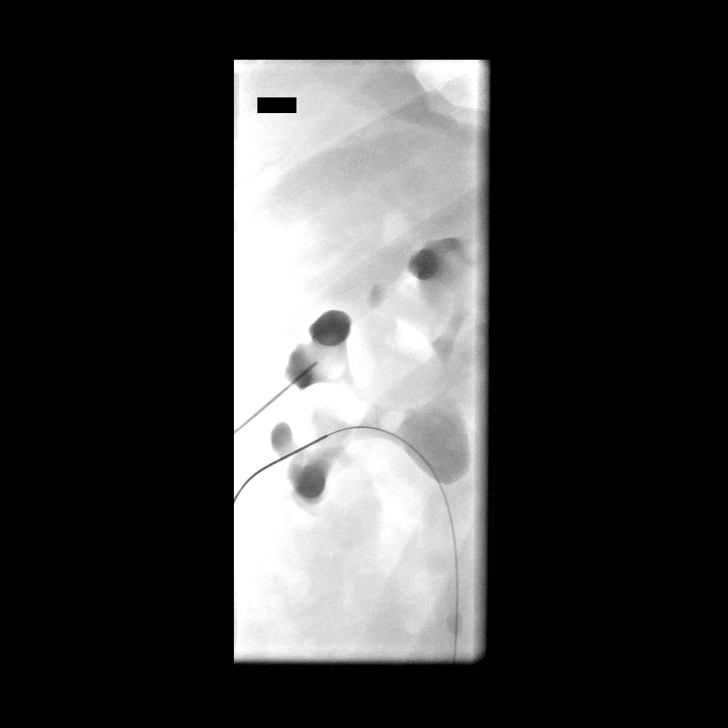
[im 2/2]
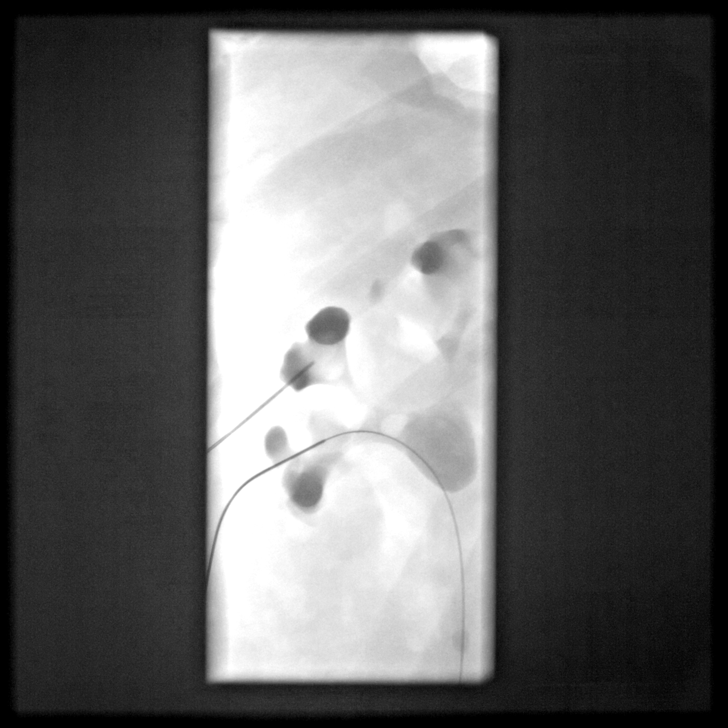

[Series 4: care single · 2 of 2 slices shown (3 of 4)]
[im 1/2]
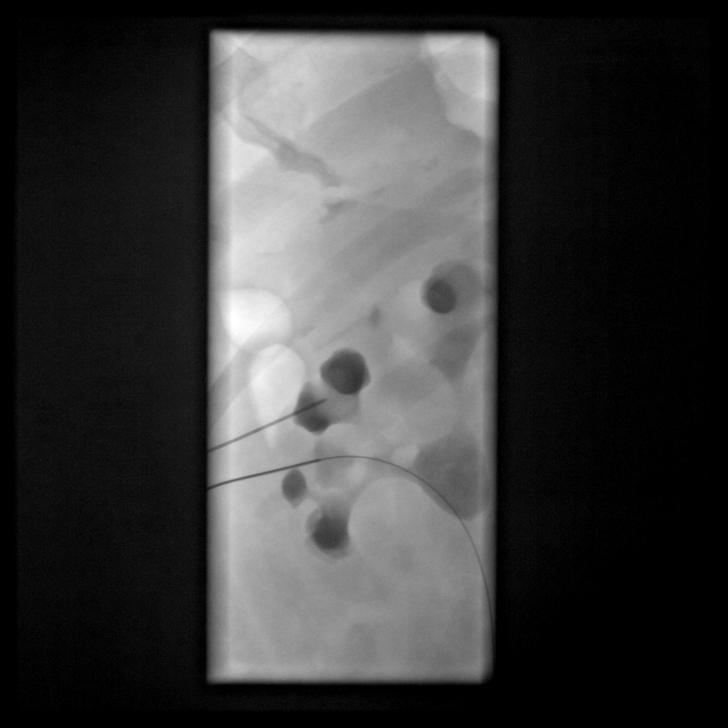
[im 2/2]
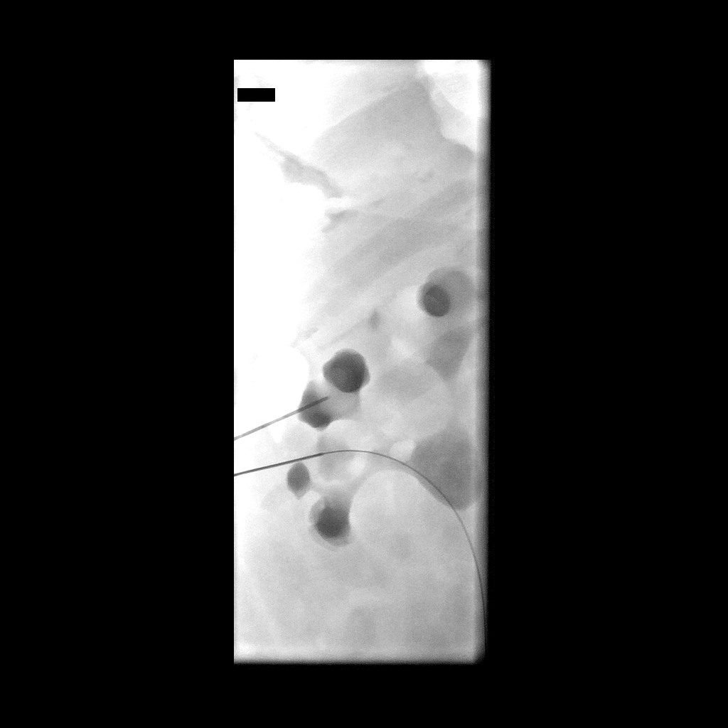

[Series 5: fl - angio · 4 of 17 frames shown]
[frame 3/17]
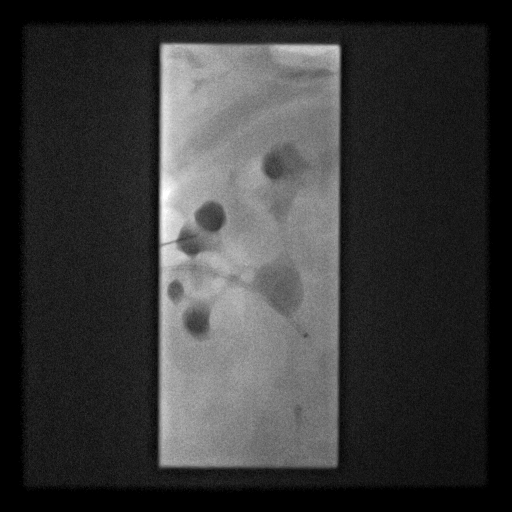
[frame 4/17]
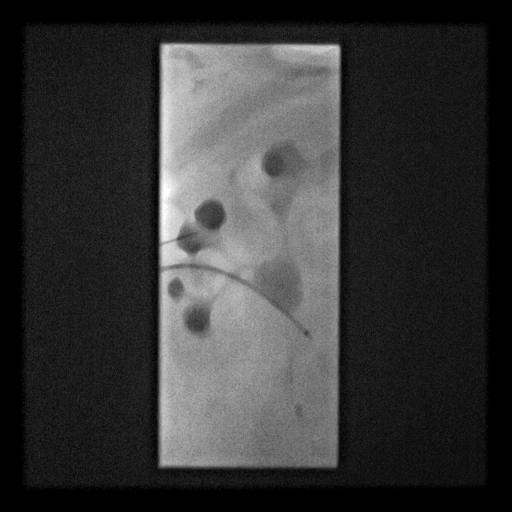
[frame 9/17]
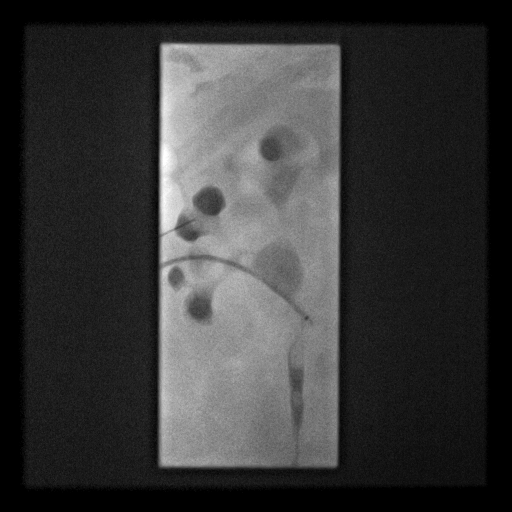
[frame 15/17]
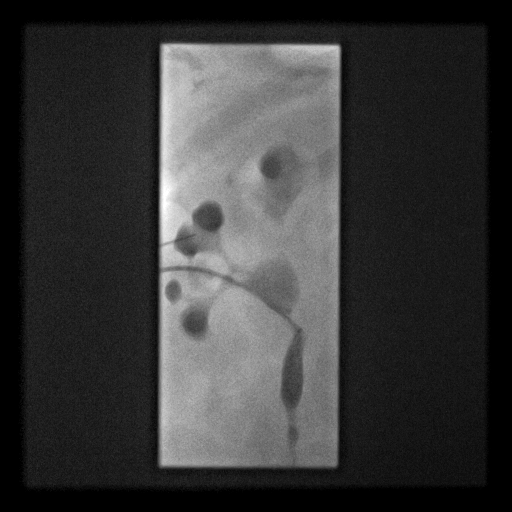

[Series 6: care single · 2 of 2 slices shown (4 of 4)]
[im 1/2]
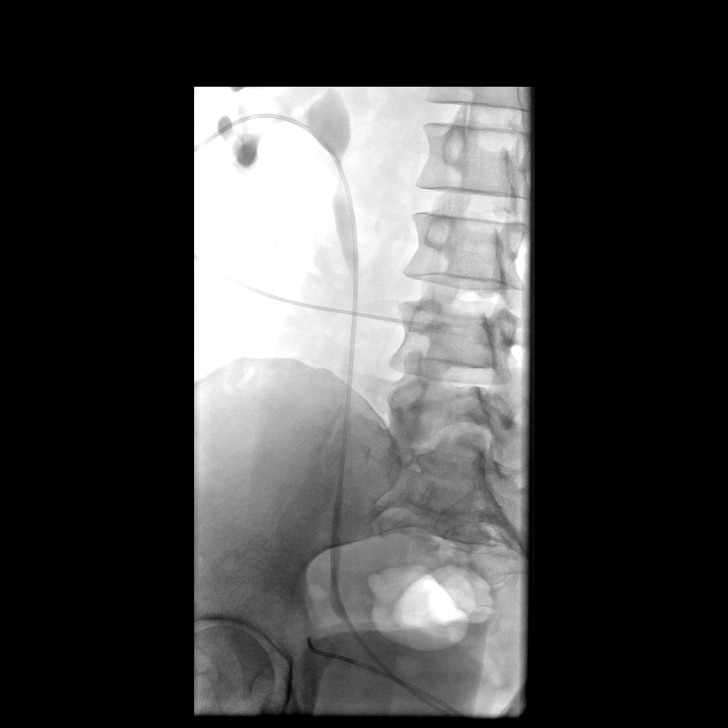
[im 2/2]
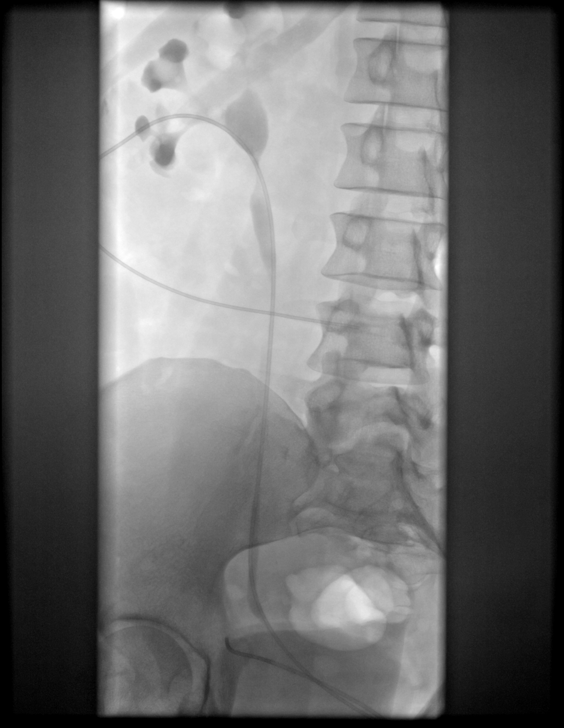

[12 of 12 positions shown; findings below may reference images not displayed]

MEDICATIONS:
Ampicillin 2 g IV; The antibiotic was administered in an appropriate
time frame prior to skin puncture.

ANESTHESIA/SEDATION:
Fentanyl 200 mcg IV; Versed 4 mg IV

Moderate Sedation Time:  28 minutes

The patient was continuously monitored during the procedure by the
interventional radiology nurse under my direct supervision.

FLUOROSCOPY TIME:  Fluoroscopy Time: 10 minutes 42 seconds (66 mGy).

COMPLICATIONS:
None immediate.

PROCEDURE:
Informed written consent was obtained from the patient after a
thorough discussion of the procedural risks, benefits and
alternatives. All questions were addressed. Maximal Sterile Barrier
Technique was utilized including caps, mask, sterile gowns, sterile
gloves, sterile drape, hand hygiene and skin antiseptic. A timeout
was performed prior to the initiation of the procedure.

Left flank skin prepped and draped in usual fashion. Sterile
ultrasound probe cover and gel utilized throughout the procedure.

Using ultrasound guidance, a dilated mid pole calyx was accessed
with a 21 gauge needle. Urine return was noted from the 21 gauge
needle. Contrast administered through the needle opacified the renal
collecting system. Air was also administered through the needle in
order to opacify a posterior calyx.

Utilizing fluoroscopic guidance, 1 of the stone containing posterior
calices was accessed with a 21 gauge needle. Urine return was noted.
0.018 inch guidewire advanced through the needle into the ureter.

Transitional dilator inserted over 0.018 inch guidewire. Contrast
administered under fluoroscopy through the 5 French dilator
opacified the ureter and renal collecting system.

Glidewire and Kumpe catheter were advanced into the bladder.
Glidewire removed and Kumpe catheter secured to skin with tape.
Patient tolerated the procedure well. He was transferred back to
recovery in anticipation of PCNL in the OR.
IMPRESSION: Successful insertion of left nephroureteral catheter for purposes of
PCNL. Lower pole posterior inferior stone containing calyx was
accessed.
